# Patient Record
Sex: Male | Born: 1940 | Race: White | Hispanic: No | Marital: Married | Smoking: Former smoker
Health system: Southern US, Community
[De-identification: ages and names within clinical notes are randomized; demographics above are authoritative.]

## PROBLEM LIST (undated history)

## (undated) DIAGNOSIS — I499 Cardiac arrhythmia, unspecified: Secondary | ICD-10-CM

## (undated) DIAGNOSIS — I4891 Unspecified atrial fibrillation: Secondary | ICD-10-CM

## (undated) DIAGNOSIS — E162 Hypoglycemia, unspecified: Secondary | ICD-10-CM

## (undated) DIAGNOSIS — J302 Other seasonal allergic rhinitis: Secondary | ICD-10-CM

## (undated) HISTORY — PX: HERNIA REPAIR: SHX51

## (undated) HISTORY — PX: JOINT REPLACEMENT: SHX530

## (undated) HISTORY — PX: OTHER SURGICAL HISTORY: SHX169

---

## 2015-02-07 ENCOUNTER — Encounter
Admission: RE | Disposition: A | Discharge status: Home / Self Care | Payer: Self-pay | Source: Ambulatory Visit | Attending: Gastroenterology

## 2015-02-07 ENCOUNTER — Ambulatory Visit
Admission: RE | Admit: 2015-02-07 | Discharge: 2015-02-07 | Disposition: A | Discharge status: Home / Self Care | Payer: Commercial Managed Care - HMO | Source: Ambulatory Visit | Attending: Gastroenterology | Admitting: Gastroenterology

## 2015-02-07 ENCOUNTER — Encounter: Payer: Self-pay | Admitting: *Deleted

## 2015-02-07 ENCOUNTER — Ambulatory Visit: Payer: Commercial Managed Care - HMO | Admitting: Anesthesiology

## 2015-02-07 DIAGNOSIS — D125 Benign neoplasm of sigmoid colon: Secondary | ICD-10-CM | POA: Insufficient documentation

## 2015-02-07 DIAGNOSIS — Z87891 Personal history of nicotine dependence: Secondary | ICD-10-CM | POA: Insufficient documentation

## 2015-02-07 DIAGNOSIS — D127 Benign neoplasm of rectosigmoid junction: Secondary | ICD-10-CM | POA: Diagnosis not present

## 2015-02-07 DIAGNOSIS — D120 Benign neoplasm of cecum: Secondary | ICD-10-CM | POA: Insufficient documentation

## 2015-02-07 DIAGNOSIS — Z791 Long term (current) use of non-steroidal anti-inflammatories (NSAID): Secondary | ICD-10-CM | POA: Insufficient documentation

## 2015-02-07 DIAGNOSIS — Z1211 Encounter for screening for malignant neoplasm of colon: Secondary | ICD-10-CM | POA: Insufficient documentation

## 2015-02-07 DIAGNOSIS — Z79899 Other long term (current) drug therapy: Secondary | ICD-10-CM | POA: Diagnosis not present

## 2015-02-07 HISTORY — DX: Other seasonal allergic rhinitis: J30.2

## 2015-02-07 HISTORY — PX: COLONOSCOPY: SHX5424

## 2015-02-07 SURGERY — COLONOSCOPY
Anesthesia: General

## 2015-02-07 MED ORDER — SODIUM CHLORIDE 0.9 % IV SOLN: INTRAVENOUS | Status: DC

## 2015-02-07 MED ORDER — PROPOFOL INFUSION 10 MG/ML OPTIME
INTRAVENOUS | Status: DC | PRN
  Administered 2015-02-07: 150 ug/kg/min via INTRAVENOUS

## 2015-02-07 MED ORDER — PROPOFOL 10 MG/ML IV BOLUS
INTRAVENOUS | Status: DC | PRN
  Administered 2015-02-07: 50 mg via INTRAVENOUS

## 2015-02-07 MED ORDER — FENTANYL CITRATE (PF) 100 MCG/2ML IJ SOLN
INTRAMUSCULAR | Status: DC | PRN
  Administered 2015-02-07: 50 ug via INTRAVENOUS

## 2015-02-07 MED ORDER — SODIUM CHLORIDE 0.9 % IV SOLN
INTRAVENOUS | Status: DC
Start: 2015-02-07 — End: 2015-02-07
  Administered 2015-02-07 (×3): via INTRAVENOUS

## 2015-02-07 MED ORDER — MIDAZOLAM HCL 5 MG/5ML IJ SOLN
INTRAMUSCULAR | Status: DC | PRN
  Administered 2015-02-07: 2 mg via INTRAVENOUS

## 2015-02-07 NOTE — Transfer of Care (Signed)
Immediate Anesthesia Transfer of Care Note  Patient: Neil Garcia  Procedure(s) Performed: Procedure(s): COLONOSCOPY (N/A)  Patient Location: PACU  Anesthesia Type:General  Level of Consciousness: sedated  Airway & Oxygen Therapy: Patient Spontanous Breathing and Patient connected to nasal cannula oxygen  Post-op Assessment: Report given to RN and Post -op Vital signs reviewed and stable  Post vital signs: Reviewed and stable  Last Vitals:  Filed Vitals:   02/07/15 0949  BP: 158/76  Pulse: 72  Temp: 36.4 C  Resp: 14    Complications: No apparent anesthesia complications

## 2015-02-07 NOTE — Anesthesia Preprocedure Evaluation (Signed)
Anesthesia Evaluation  Patient identified by MRN, date of birth, ID band Patient awake    Reviewed: Allergy & Precautions, H&P , NPO status , Patient's Chart, lab work & pertinent test results, reviewed documented beta blocker date and time   Airway Mallampati: II  TM Distance: >3 FB Neck ROM: full    Dental no notable dental hx.    Pulmonary neg pulmonary ROS, former smoker,  breath sounds clear to auscultation  Pulmonary exam normal       Cardiovascular Exercise Tolerance: Good negative cardio ROS  Rhythm:regular Rate:Normal     Neuro/Psych negative neurological ROS  negative psych ROS   GI/Hepatic negative GI ROS, Neg liver ROS,   Endo/Other  negative endocrine ROS  Renal/GU negative Renal ROS  negative genitourinary   Musculoskeletal   Abdominal   Peds  Hematology negative hematology ROS (+)   Anesthesia Other Findings   Reproductive/Obstetrics negative OB ROS                             Anesthesia Physical Anesthesia Plan  ASA: II  Anesthesia Plan: General   Post-op Pain Management:    Induction:   Airway Management Planned:   Additional Equipment:   Intra-op Plan:   Post-operative Plan:   Informed Consent: I have reviewed the patients History and Physical, chart, labs and discussed the procedure including the risks, benefits and alternatives for the proposed anesthesia with the patient or authorized representative who has indicated his/her understanding and acceptance.   Dental Advisory Given  Plan Discussed with: CRNA  Anesthesia Plan Comments:         Anesthesia Quick Evaluation

## 2015-02-07 NOTE — Anesthesia Postprocedure Evaluation (Signed)
  Anesthesia Post-op Note  Patient: Neil Garcia  Procedure(s) Performed: Procedure(s): COLONOSCOPY (N/A)  Anesthesia type:General  Patient location: PACU  Post pain: Pain level controlled  Post assessment: Post-op Vital signs reviewed, Patient's Cardiovascular Status Stable, Respiratory Function Stable, Patent Airway and No signs of Nausea or vomiting  Post vital signs: Reviewed and stable  Last Vitals:  Filed Vitals:   02/07/15 1220  BP: 112/71  Pulse: 68  Temp:   Resp: 21    Level of consciousness: awake, alert  and patient cooperative  Complications: No apparent anesthesia complications

## 2015-02-07 NOTE — Discharge Instructions (Signed)

## 2015-02-07 NOTE — Op Note (Signed)
[facility] Gastroenterology Patient Name: Neil Garcia Procedure Date: 02/07/2015 10:53 AM MRN: [id number] Account #: [id number] Date of Birth: 07-29-1941 Admit Type: Outpatient Age: 74 Room: [facility] ENDO ROOM 2 Gender: Male Note Status: Finalized Procedure:         Colonoscopy Indications:       Screening for colorectal malignant neoplasm, This is the                     patient's first colonoscopy Patient Profile:   This is a 74 year old male. Providers:         Gerrit Heck. Rayann Heman, MD Referring MD:      Ocie Cornfield. Ouida Sills, MD (Referring MD) Medicines:         Propofol per Anesthesia Complications:     No immediate complications. Procedure:         Pre-Anesthesia Assessment:                    - Prior to the procedure, a History and Physical was                     performed, and patient medications, allergies and                     sensitivities were reviewed. The patient's tolerance of                     previous anesthesia was reviewed.                    After obtaining informed consent, the colonoscope was                     passed under direct vision. Throughout the procedure, the                     patient's blood pressure, pulse, and oxygen saturations                     were monitored continuously. The Colonoscope was                     introduced through the anus and advanced to the the cecum,                     identified by appendiceal orifice and ileocecal valve. The                     colonoscopy was performed without difficulty. The patient                     tolerated the procedure well. The quality of the bowel                     preparation was excellent. Findings:      The perianal and digital rectal examinations were normal.      A 8 mm polyp was found in the cecum. The polyp was sessile. The polyp       was removed with a hot snare. Resection and retrieval were complete.      A 2 mm polyp was found in the cecum. The polyp was sessile.  The polyp       was removed with a jumbo cold forceps. Resection and retrieval were       complete.  A 3 mm polyp was found in the cecum. The polyp was sessile. The polyp       was removed with a cold snare. Resection and retrieval were complete.      Three sessile polyps were found in the recto-sigmoid colon. The polyps       were 2 to 3 mm in size. These polyps were removed with a jumbo cold       forceps. Resection and retrieval were complete.      The exam was otherwise without abnormality on direct and retroflexion       views. Impression:        - One 8 mm polyp in the cecum. Resected and retrieved.                    - One 2 mm polyp in the cecum. Resected and retrieved.                    - One 3 mm polyp in the cecum. Resected and retrieved.                    - Three 2 to 3 mm polyps at the recto-sigmoid colon.                     Resected and retrieved.                    - The examination was otherwise normal on direct and                     retroflexion views. Recommendation:    - Observe patient in GI recovery unit.                    - High fiber diet.                    - Continue present medications.                    - Await pathology results.                    - Repeat colonoscopy for surveillance based on pathology                     results.                    - Return to referring physician.                    - The findings and recommendations were discussed with the                     patient.                    - The findings and recommendations were discussed with the                     patient's family. Procedure Code(s): --- Professional ---                    [phone], Colonoscopy, flexible; with removal of tumor(s),                     polyp(s), or other lesion(s) by snare technique  45380, 59, Colonoscopy, flexible; with biopsy, single or                     multiple CPT copyright 2014 American Medical Association. All rights  reserved. The codes documented in this report are preliminary and upon coder review may  be revised to meet current compliance requirements. Mellody Life, MD 02/07/2015 11:42:31 AM This report has been signed electronically. Number of Addenda: 0 Note Initiated On: 02/07/2015 10:53 AM Scope Withdrawal Time: 0 hours 15 minutes 6 seconds  Total Procedure Duration: 0 hours 18 minutes 52 seconds       [facility]

## 2015-02-07 NOTE — H&P (Signed)
  Primary Care Physician:  Kirk Ruths., MD  Pre-Procedure History & Physical: HPI:  Neil Garcia is a 74 y.o. male is here for an endoscopy.   Past Medical History  Diagnosis Date  . Seasonal allergies     Past Surgical History  Procedure Laterality Date  . Joint replacement      LT Hip 2013  . Collarbone Left   . Hernia repair      Prior to Admission medications   Medication Sig Start Date End Date Taking? Authorizing Provider  ibuprofen (ADVIL,MOTRIN) 200 MG tablet Take 400 mg by mouth every 6 (six) hours as needed for moderate pain.   Yes Historical Provider, MD  loratadine (CLARITIN) 10 MG tablet Take 10 mg by mouth daily as needed for allergies.   Yes Historical Provider, MD  terazosin (HYTRIN) 5 MG capsule Take 5 mg by mouth daily.   Yes Historical Provider, MD    Allergies as of 01/20/2015  . (Not on File)    History reviewed. No pertinent family history.  History   Social History  . Marital Status: Married    Spouse Name: N/A  . Number of Children: N/A  . Years of Education: N/A   Occupational History  . Not on file.   Social History Main Topics  . Smoking status: Former Research scientist (life sciences)  . Smokeless tobacco: Former Systems developer  . Alcohol Use: No  . Drug Use: No  . Sexual Activity: Not on file   Other Topics Concern  . Not on file   Social History Narrative  . No narrative on file     Physical Exam: BP 158/76 mmHg  Pulse 72  Temp(Src) 97.6 F (36.4 C) (Tympanic)  Resp 14  Ht 5\' 11"  (1.803 m)  Wt 294 lb (133.358 kg)  BMI 41.02 kg/m2  SpO2 98% General:   Alert,  pleasant and cooperative in NAD Head:  Normocephalic and atraumatic. Neck:  Supple; no masses or thyromegaly. Lungs:  Clear throughout to auscultation.    Heart:  Regular rate and rhythm. Abdomen:  Soft, nontender and nondistended. Normal bowel sounds, without guarding, and without rebound.   Neurologic:  Alert and  oriented x4;  grossly normal neurologically.  Impression/Plan: Neil L  Garcia is here for an endoscopy to be performed for screening  Risks, benefits, limitations, and alternatives regarding  colonoscopy have been reviewed with the patient.  Questions have been answered.  All parties agreeable.   Neil Class, MD  02/07/2015, 11:14 AM

## 2015-02-11 ENCOUNTER — Encounter: Payer: Self-pay | Admitting: Gastroenterology

## 2015-02-11 LAB — SURGICAL PATHOLOGY

## 2016-04-06 ENCOUNTER — Inpatient Hospital Stay
Admission: EM | Admit: 2016-04-06 | Discharge: 2016-04-08 | DRG: 871 | Disposition: A | Discharge status: Home / Self Care | Payer: Medicare HMO | Attending: Internal Medicine | Admitting: Internal Medicine

## 2016-04-06 ENCOUNTER — Emergency Department: Payer: Medicare HMO

## 2016-04-06 ENCOUNTER — Inpatient Hospital Stay
Admit: 2016-04-06 | Discharge: 2016-04-06 | Disposition: A | Discharge status: Home / Self Care | Payer: Medicare HMO | Attending: Specialist | Admitting: Specialist

## 2016-04-06 DIAGNOSIS — I4891 Unspecified atrial fibrillation: Secondary | ICD-10-CM | POA: Diagnosis present

## 2016-04-06 DIAGNOSIS — Z96642 Presence of left artificial hip joint: Secondary | ICD-10-CM | POA: Diagnosis present

## 2016-04-06 DIAGNOSIS — G4733 Obstructive sleep apnea (adult) (pediatric): Secondary | ICD-10-CM | POA: Diagnosis present

## 2016-04-06 DIAGNOSIS — Z6841 Body Mass Index (BMI) 40.0 and over, adult: Secondary | ICD-10-CM | POA: Diagnosis not present

## 2016-04-06 DIAGNOSIS — A419 Sepsis, unspecified organism: Secondary | ICD-10-CM | POA: Diagnosis present

## 2016-04-06 DIAGNOSIS — Z820 Family history of epilepsy and other diseases of the nervous system: Secondary | ICD-10-CM

## 2016-04-06 DIAGNOSIS — Z808 Family history of malignant neoplasm of other organs or systems: Secondary | ICD-10-CM | POA: Diagnosis not present

## 2016-04-06 DIAGNOSIS — Z87891 Personal history of nicotine dependence: Secondary | ICD-10-CM | POA: Diagnosis not present

## 2016-04-06 DIAGNOSIS — J189 Pneumonia, unspecified organism: Secondary | ICD-10-CM | POA: Diagnosis present

## 2016-04-06 HISTORY — DX: Hypoglycemia, unspecified: E16.2

## 2016-04-06 HISTORY — DX: Unspecified atrial fibrillation: I48.91

## 2016-04-06 LAB — COMPREHENSIVE METABOLIC PANEL
ALT: 21 U/L (ref 17–63)
AST: 27 U/L (ref 15–41)
Albumin: 4.3 g/dL (ref 3.5–5.0)
Alkaline Phosphatase: 67 U/L (ref 38–126)
Anion gap: 8 (ref 5–15)
BUN: 13 mg/dL (ref 6–20)
CHLORIDE: 104 mmol/L (ref 101–111)
CO2: 26 mmol/L (ref 22–32)
CREATININE: 0.96 mg/dL (ref 0.61–1.24)
Calcium: 8.9 mg/dL (ref 8.9–10.3)
GFR calc non Af Amer: >60 mL/min (ref >60)
Glucose, Bld: 98 mg/dL (ref 65–99)
Potassium: 4.3 mmol/L (ref 3.5–5.1)
SODIUM: 138 mmol/L (ref 135–145)
Total Bilirubin: 0.7 mg/dL (ref 0.3–1.2)
Total Protein: 7.3 g/dL (ref 6.5–8.1)

## 2016-04-06 LAB — URINALYSIS COMPLETE WITH MICROSCOPIC (ARMC ONLY)
BACTERIA UA: NONE SEEN
Bilirubin Urine: NEGATIVE
Glucose, UA: NEGATIVE mg/dL
HGB URINE DIPSTICK: NEGATIVE
Ketones, ur: NEGATIVE mg/dL
LEUKOCYTES UA: NEGATIVE
NITRITE: NEGATIVE
PROTEIN: NEGATIVE mg/dL
RBC / HPF: NONE SEEN RBC/hpf (ref 0–5)
SPECIFIC GRAVITY, URINE: 1.01 (ref 1.005–1.030)
Squamous Epithelial / LPF: NONE SEEN
pH: 5 (ref 5.0–8.0)

## 2016-04-06 LAB — CBC WITH DIFFERENTIAL/PLATELET
Basophils Absolute: 0 10*3/uL (ref 0–0.1)
Basophils Relative: 0 %
EOS ABS: 0.1 10*3/uL (ref 0–0.7)
Eosinophils Relative: 1 %
HEMATOCRIT: 44.9 % (ref 40.0–52.0)
HEMOGLOBIN: 15 g/dL (ref 13.0–18.0)
LYMPHS ABS: 1.4 10*3/uL (ref 1.0–3.6)
Lymphocytes Relative: 12 %
MCH: 27.8 pg (ref 26.0–34.0)
MCHC: 33.4 g/dL (ref 32.0–36.0)
MCV: 83.2 fL (ref 80.0–100.0)
MONOS PCT: 5 %
Monocytes Absolute: 0.7 10*3/uL (ref 0.2–1.0)
NEUTROS ABS: 10.3 10*3/uL — AB (ref 1.4–6.5)
NEUTROS PCT: 82 %
Platelets: 162 10*3/uL (ref 150–440)
RBC: 5.4 MIL/uL (ref 4.40–5.90)
RDW: 13.6 % (ref 11.5–14.5)
WBC: 12.6 10*3/uL — ABNORMAL HIGH (ref 3.8–10.6)

## 2016-04-06 LAB — TROPONIN I
Troponin I: <0.03 ng/mL (ref <0.03)
Troponin I: <0.03 ng/mL (ref <0.03)

## 2016-04-06 LAB — ECHOCARDIOGRAM COMPLETE
Height: 71 in
Weight: 4800 oz

## 2016-04-06 LAB — LACTIC ACID, PLASMA
Lactic Acid, Venous: 2.1 mmol/L (ref 0.5–1.9)
Lactic Acid, Venous: 1 mmol/L (ref 0.5–1.9)

## 2016-04-06 MED ORDER — PNEUMOCOCCAL VAC POLYVALENT 25 MCG/0.5ML IJ INJ: 0.5000 mL | INJECTION | INTRAMUSCULAR | Status: DC

## 2016-04-06 MED ORDER — SODIUM CHLORIDE 0.9 % IV BOLUS (SEPSIS)
1000.0000 mL | Freq: Once | INTRAVENOUS | Status: AC
Start: 2016-04-06 — End: 2016-04-06
  Administered 2016-04-06: 1000 mL via INTRAVENOUS

## 2016-04-06 MED ORDER — SODIUM CHLORIDE 0.9 % IV BOLUS (SEPSIS)
500.0000 mL | Freq: Once | INTRAVENOUS | Status: AC
  Administered 2016-04-06: 500 mL via INTRAVENOUS

## 2016-04-06 MED ORDER — ENOXAPARIN SODIUM 40 MG/0.4ML ~~LOC~~ SOLN
40.0000 mg | SUBCUTANEOUS | Status: DC
  Administered 2016-04-06: 40 mg via SUBCUTANEOUS
  Filled 2016-04-06: qty 0.4

## 2016-04-06 MED ORDER — ACETAMINOPHEN 650 MG RE SUPP: 650.0000 mg | Freq: Four times a day (QID) | RECTAL | Status: DC | PRN

## 2016-04-06 MED ORDER — ASPIRIN EC 81 MG PO TBEC
81.0000 mg | DELAYED_RELEASE_TABLET | Freq: Every day | ORAL | Status: DC
  Administered 2016-04-06 – 2016-04-08 (×3): 81 mg via ORAL
  Filled 2016-04-06 (×3): qty 1

## 2016-04-06 MED ORDER — ONDANSETRON HCL 4 MG PO TABS: 4.0000 mg | ORAL_TABLET | Freq: Four times a day (QID) | ORAL | Status: DC | PRN

## 2016-04-06 MED ORDER — DEXTROSE 5 % IV SOLN
250.0000 mg | INTRAVENOUS | Status: DC
  Administered 2016-04-07: 250 mg via INTRAVENOUS
  Filled 2016-04-06 (×2): qty 250

## 2016-04-06 MED ORDER — SODIUM CHLORIDE 0.9 % IV BOLUS (SEPSIS)
1000.0000 mL | Freq: Once | INTRAVENOUS | Status: AC
  Administered 2016-04-06: 1000 mL via INTRAVENOUS

## 2016-04-06 MED ORDER — DEXTROSE 5 % IV SOLN
1.0000 g | INTRAVENOUS | Status: DC
  Administered 2016-04-07: 1 g via INTRAVENOUS
  Filled 2016-04-06 (×2): qty 10

## 2016-04-06 MED ORDER — ACETAMINOPHEN 325 MG PO TABS: 650.0000 mg | ORAL_TABLET | Freq: Four times a day (QID) | ORAL | Status: DC | PRN

## 2016-04-06 MED ORDER — DILTIAZEM HCL 30 MG PO TABS
30.0000 mg | ORAL_TABLET | Freq: Four times a day (QID) | ORAL | Status: DC
  Administered 2016-04-06 – 2016-04-08 (×7): 30 mg via ORAL
  Filled 2016-04-06 (×7): qty 1

## 2016-04-06 MED ORDER — ONDANSETRON HCL 4 MG/2ML IJ SOLN: 4.0000 mg | Freq: Four times a day (QID) | INTRAMUSCULAR | Status: DC | PRN

## 2016-04-06 MED ORDER — DEXTROSE 5 % IV SOLN: 500.0000 mg | INTRAVENOUS | Status: DC

## 2016-04-06 MED ORDER — SODIUM CHLORIDE 0.9% FLUSH
3.0000 mL | Freq: Two times a day (BID) | INTRAVENOUS | Status: DC
  Administered 2016-04-06 – 2016-04-07 (×4): 3 mL via INTRAVENOUS

## 2016-04-06 MED ORDER — DILTIAZEM HCL 25 MG/5ML IV SOLN: 10.0000 mg | Freq: Four times a day (QID) | INTRAVENOUS | Status: DC | PRN

## 2016-04-06 MED ORDER — TERAZOSIN HCL 5 MG PO CAPS
5.0000 mg | ORAL_CAPSULE | Freq: Every day | ORAL | Status: DC
  Administered 2016-04-06 – 2016-04-07 (×2): 5 mg via ORAL
  Filled 2016-04-06 (×3): qty 1

## 2016-04-06 MED ORDER — DEXTROSE 5 % IV SOLN
1.0000 g | Freq: Once | INTRAVENOUS | Status: AC
  Administered 2016-04-06: 1 g via INTRAVENOUS
  Filled 2016-04-06: qty 10

## 2016-04-06 MED ORDER — CEFTRIAXONE SODIUM 1 G IJ SOLR: 1.0000 g | INTRAMUSCULAR | Status: DC

## 2016-04-06 MED ORDER — DILTIAZEM HCL 25 MG/5ML IV SOLN
10.0000 mg | Freq: Once | INTRAVENOUS | Status: AC
  Administered 2016-04-06: 10 mg via INTRAVENOUS
  Filled 2016-04-06: qty 5

## 2016-04-06 MED ORDER — LORATADINE 10 MG PO TABS
10.0000 mg | ORAL_TABLET | Freq: Every day | ORAL | Status: DC
  Administered 2016-04-06 – 2016-04-08 (×3): 10 mg via ORAL
  Filled 2016-04-06 (×3): qty 1

## 2016-04-06 MED ORDER — DEXTROSE 5 % IV SOLN
500.0000 mg | Freq: Once | INTRAVENOUS | Status: AC
  Administered 2016-04-06: 500 mg via INTRAVENOUS
  Filled 2016-04-06: qty 500

## 2016-04-06 NOTE — ED Triage Notes (Addendum)
Pt c/o cough with congestion for the past week and a half. States he has brown, blood tinged sputum. Pt c/o periods of dizziness..the patient is in NAD, respirations WNL.Marland Kitchen

## 2016-04-06 NOTE — Progress Notes (Signed)
*  PRELIMINARY RESULTS* Echocardiogram 2D Echocardiogram has been performed.  Sherrie Sport 04/06/2016, 3:41 PM

## 2016-04-06 NOTE — H&P (Signed)
[facility] [place] NAME: Neil Garcia    MR#:  [id number]  DATE OF BIRTH:  07-Oct-1940  DATE OF ADMISSION:  04/06/2016  PRIMARY CARE PHYSICIAN: Kirk Ruths., MD   REQUESTING/REFERRING PHYSICIAN: Dr. Kerman Passey  CHIEF COMPLAINT:   Chief Complaint  Patient presents with  . Cough    HISTORY OF PRESENT ILLNESS:  Neil Garcia  is a 75 y.o. male with a known history Hypoglycemia, who resents to the hospital complaining of cough, shortness of breath with blood-tinged sputum progressively getting worse. Patient says for the past 2 days he has just not been feeling well with the cough being the most bothersome symptom. His cough kept him up most of the night last night and this morning he noticed that he had blood-tinged sputum and he was a bit concerned and came to the ER further evaluation. He was noted to be in rapid atrial fibrillation with RVR with heart rates in the 180s and also chest findings suggestive of a right middle lobe pneumonia. He did have a fever of 100.4 yesterday at home. He denies any recent sick contacts. Patient was given some IV Cardizem which improved his heart rate and given his clinical symptoms hospitalist services were contacted further treatment and evaluation.  PAST MEDICAL HISTORY:   Past Medical History:  Diagnosis Date  . Atrial fibrillation ([place])   . Hypoglycemia   . Seasonal allergies     PAST SURGICAL HISTORY:   Past Surgical History:  Procedure Laterality Date  . collarbone Left   . COLONOSCOPY N/A 02/07/2015   Procedure: COLONOSCOPY;  Surgeon: Josefine Class, MD;  Location: [facility] ENDOSCOPY;  Service: Endoscopy;  Laterality: N/A;  . HERNIA REPAIR    . JOINT REPLACEMENT     LT Hip 2013    SOCIAL HISTORY:   Social History  Substance Use Topics  . Smoking status: Former Research scientist (life sciences)  . Smokeless tobacco: Former Systems developer  . Alcohol use No    FAMILY HISTORY:   Family History  Problem Relation Age of  Onset  . Alzheimer's disease Mother   . Brain cancer Father     DRUG ALLERGIES:   Allergies  Allergen Reactions  . Prednisone Other (See Comments)    Causes patient to become very nervous; insomnia  . Statins Other (See Comments)    Cramping and weak   . Celecoxib Rash and Other (See Comments)    Nervousness, hyper     REVIEW OF SYSTEMS:   Review of Systems  Constitutional: Negative for fever and weight loss.  HENT: Negative for congestion, nosebleeds and tinnitus.   Eyes: Negative for blurred vision, double vision and redness.  Respiratory: Positive for cough, hemoptysis, sputum production and shortness of breath.   Cardiovascular: Negative for chest pain, orthopnea, leg swelling and PND.  Gastrointestinal: Negative for abdominal pain, diarrhea, melena, nausea and vomiting.  Genitourinary: Negative for dysuria, hematuria and urgency.  Musculoskeletal: Negative for falls and joint pain.  Neurological: Negative for dizziness, tingling, sensory change, focal weakness, seizures, weakness and headaches.  Endo/Heme/Allergies: Negative for polydipsia. Does not bruise/bleed easily.  Psychiatric/Behavioral: Negative for depression and memory loss. The patient is not nervous/anxious.     MEDICATIONS AT HOME:   Prior to Admission medications   Medication Sig Start Date End Date Taking? Authorizing Provider  aspirin EC 81 MG tablet Take 162 mg by mouth once.   Yes Historical Provider, MD  ibuprofen (ADVIL,MOTRIN) 200 MG tablet Take 400 mg by mouth  every 6 (six) hours as needed for moderate pain.   Yes Historical Provider, MD  loratadine (CLARITIN) 10 MG tablet Take 10 mg by mouth daily.    Yes Historical Provider, MD  terazosin (HYTRIN) 5 MG capsule Take 5 mg by mouth at bedtime.    Yes Historical Provider, MD      VITAL SIGNS:  Blood pressure 137/68, pulse (!) 110, temperature 98.2 F (36.8 C), temperature source Oral, resp. rate 15, height 5' 11"  (1.803 m), weight 136.1 kg (300  lb), SpO2 98 %.  PHYSICAL EXAMINATION:  Physical Exam  GENERAL:  75 y.o.-year-old obese patient lying in the bed in mild resp. Distress.   EYES: Pupils equal, round, reactive to light and accommodation. No scleral icterus. Extraocular muscles intact.  HEENT: Head atraumatic, normocephalic. Oropharynx and nasopharynx clear. No oropharyngeal erythema, moist oral mucosa  NECK:  Supple, no jugular venous distention. No thyroid enlargement, no tenderness.  LUNGS: Poor respiratory effort, Minmal rhonchi on right > left, no rales, Wheezing. Negative use of accessory muscles.  CARDIOVASCULAR: S1, S2 Tachycardic. No murmurs, rubs, gallops, clicks.  ABDOMEN: Soft, nontender, nondistended. Bowel sounds present. No organomegaly or mass.  EXTREMITIES: No pedal edema, cyanosis, or clubbing. + 2 pedal & radial pulses b/l.   NEUROLOGIC: Cranial nerves II through XII are intact. No focal Motor or sensory deficits appreciated b/l PSYCHIATRIC: The patient is alert and oriented x 3. Good affect.  SKIN: No obvious rash, lesion, or ulcer.   LABORATORY PANEL:   CBC  Recent Labs Lab 04/06/16 0916  WBC 12.6*  HGB 15.0  HCT 44.9  PLT 162   ------------------------------------------------------------------------------------------------------------------  Chemistries   Recent Labs Lab 04/06/16 0916  NA 138  K 4.3  CL 104  CO2 26  GLUCOSE 98  BUN 13  CREATININE 0.96  CALCIUM 8.9  AST 27  ALT 21  ALKPHOS 67  BILITOT 0.7   ------------------------------------------------------------------------------------------------------------------  Cardiac Enzymes  Recent Labs Lab 04/06/16 0916  TROPONINI <0.03   ------------------------------------------------------------------------------------------------------------------  RADIOLOGY:  Dg Chest 2 View  Result Date: 04/06/2016 CLINICAL DATA:  Cough with congestion, shortness of breath EXAM: CHEST  2 VIEW COMPARISON:  None available FINDINGS:  Asymmetric ill-defined patchy bronchovascular opacity in the right mid and lower lung on the frontal view which appears to be anterior on the lateral. This is suspicious for a right middle lobe bronchopneumonia. Mild cardiac enlargement without CHF or edema. No effusion or pneumothorax. Trachea is midline. Degenerative spondylosis of the spine. IMPRESSION: Findings suspicious for right middle lobe bronchopneumonia. Recommend short-term radiographic follow-up to document resolution. Mild cardiomegaly without CHF Electronically Signed   By: Jerilynn Mages.  Shick M.D.   On: 04/06/2016 09:51    IMPRESSION AND PLAN:  75 year old male with past medical history of hypoglycemia who presents to the hospital shortness of breath, cough and blood-tinged sputum and noted to have pneumonia and also in A. fib with RVR.  1. Sepsis-patient met criteria on admission given his leukocytosis, elevated lactic acid, and chest x-ray findings suggestive of pneumonia. -I will treat the patient with IV ceftriaxone and Zithromax for pneumonia. Follow white cell count, follow blood, sputum cultures.  2. Pneumonia-likely community acquired. -Chest x-ray findings suggestive of right middle lobe pneumonia. Continue IV ceftriaxone, Zithromax, follow blood, sputum cultures.  3. Atrial fibrillation with rapid ventricular response-patient presented to the hospital with heart rates in the 180s. This could be secondary to the underlying sepsis/pneumonia. -I will place him on telemetry, cycle his cardiac markers, check a two-dimensional echocardiogram. -  Place on oral Cardizem for rate control, also IV Cardizem as needed. Cardiology consult.  4. Leukocytosis-secondary to pneumonia and sepsis. -Follow white cell count with IV antibiotic therapy.    All the records are reviewed and case discussed with ED provider. Management plans discussed with the patient, family and they are in agreement.  CODE STATUS:  Full  TOTAL TIME TAKING CARE OF THIS  PATIENT:  45 minutes.    Henreitta Leber M.D on 04/06/2016 at 12:31 PM  Between 7am to 6pm - Pager - [phone]  After 6pm go to [email or web address] - password [email or web address] [facility]  Office  [phone]  CC: Primary care physician; Kirk Ruths., MD

## 2016-04-06 NOTE — Consult Note (Signed)
[facility] [facility]  CARDIOLOGY CONSULT NOTE  Patient ID: Neil Garcia MRN: [id number] DOB/AGE: Jan 19, 1941 75 y.o.  Admit date: 04/06/2016 Referring Physician Dr. Posey Pronto Primary Physician Dr. Rosanna Randy Primary Cardiologist   Reason for Consultation afib  HPI: Pt I a 75 yo male with no prior cardiac history who was admitted with complaints of sob and was noted to have cxr showing right middle lobe bronchopneumonia with no evidence of chf. He was noted to haave afib with rvr. This was an apparent new finding for him. He is unaware of the rvr and feels a lot better since admission. CHADSS score is 0. Echo revealed preserved ef of 55-60 with mildly dilated right and left atrium. Pt denies diagnosis of sleep apnea or copd. He is not on oxygen at home.  He is currently hemodynamically stable.   Review of Systems  Constitutional: Positive for fever.  HENT: Negative.   Eyes: Negative.   Respiratory: Positive for cough and shortness of breath.   Cardiovascular: Positive for leg swelling.  Gastrointestinal: Negative.   Genitourinary: Negative.   Musculoskeletal: Negative.   Skin: Negative.   Neurological: Negative.   Endo/Heme/Allergies: Negative.   Psychiatric/Behavioral: Negative.     Past Medical History:  Diagnosis Date  . Atrial fibrillation ([place])   . Hypoglycemia   . Seasonal allergies     Family History  Problem Relation Age of Onset  . Alzheimer's disease Mother   . Brain cancer Father     Social History   Social History  . Marital status: Married    Spouse name: N/A  . Number of children: N/A  . Years of education: N/A   Occupational History  . Not on file.   Social History Main Topics  . Smoking status: Former Research scientist (life sciences)  . Smokeless tobacco: Former Systems developer  . Alcohol use No  . Drug use: No  . Sexual activity: Not on file   Other Topics Concern  . Not on file   Social History Narrative  . No narrative on file    Past Surgical  History:  Procedure Laterality Date  . collarbone Left   . COLONOSCOPY N/A 02/07/2015   Procedure: COLONOSCOPY;  Surgeon: Josefine Class, MD;  Location: [facility] ENDOSCOPY;  Service: Endoscopy;  Laterality: N/A;  . HERNIA REPAIR    . JOINT REPLACEMENT     LT Hip 2013     Prescriptions Prior to Admission  Medication Sig Dispense Refill Last Dose  . aspirin EC 81 MG tablet Take 162 mg by mouth once.   04/06/2016 at 0100  . ibuprofen (ADVIL,MOTRIN) 200 MG tablet Take 400 mg by mouth every 6 (six) hours as needed for moderate pain.   PRN at prn  . loratadine (CLARITIN) 10 MG tablet Take 10 mg by mouth daily.    04/05/2016 at am  . terazosin (HYTRIN) 5 MG capsule Take 5 mg by mouth at bedtime.    04/05/2016 at pm    Physical Exam: Blood pressure (!) 147/83, pulse 100, temperature 99.1 F (37.3 C), temperature source Oral, resp. rate 18, height 5\' 11"  (1.803 m), weight 136.1 kg (300 lb), SpO2 99 %.   Wt Readings from Last 1 Encounters:  04/06/16 136.1 kg (300 lb)     General appearance: alert and cooperative Head: Normocephalic, without obvious abnormality, atraumatic Resp: diminished breath sounds bilaterally Cardio: irregularly irregular rhythm GI: soft, non-tender; bowel sounds normal; no masses,  no organomegaly Extremities: edema 2+ edema in lower extremeties Neurologic:  Grossly normal  Labs:   Lab Results  Component Value Date   WBC 12.6 (H) 04/06/2016   HGB 15.0 04/06/2016   HCT 44.9 04/06/2016   MCV 83.2 04/06/2016   PLT 162 04/06/2016    Recent Labs Lab 04/06/16 0916  NA 138  K 4.3  CL 104  CO2 26  BUN 13  CREATININE 0.96  CALCIUM 8.9  PROT 7.3  BILITOT 0.7  ALKPHOS 67  ALT 21  AST 27  GLUCOSE 98   Lab Results  Component Value Date   TROPONINI <0.03 04/06/2016      Radiology: probable lll pna EKG: afib with controlled vr.   ASSESSMENT AND PLAN:  Pt with admission for community acquired pna. Note to have afib with rvr which has improved with  treatemnt of of his pnq. Afib is likely secondary to his pna. Will continue with rate control for now with cardizem. Will change to cardizem cd 180 daily at discharge. Defer chronic anticoagulation for now.   Signed: Teodoro Spray MD, [facility] 04/06/2016, 9:51 PM

## 2016-04-06 NOTE — Progress Notes (Signed)
Pharmacy Antibiotic Note  Abdelaziz Burts Luckenbaugh is a 75 y.o. male admitted on 04/06/2016 with pneumonia.  Pharmacy has been consulted for ceftriaxone and azithromycin dosing.  Plan:  Patient received azithromycin 500mg  IV and ceftriaxone 1g IV in the ED. Will begin azithromycin 250mg  IV q24 and ceftriaxone 1g IV q24 tomorrow morning.   Height: 5\' 11"  (180.3 cm) Weight: 300 lb (136.1 kg) IBW/kg (Calculated) : 75.3  Temp (24hrs), Avg:98.1 F (36.7 C), Min:98 F (36.7 C), Max:98.2 F (36.8 C)   Recent Labs Lab 04/06/16 0916 04/06/16 1037  WBC 12.6*  --   CREATININE 0.96  --   LATICACIDVEN  --  2.1*    Estimated Creatinine Clearance: 98 mL/min (by C-G formula based on SCr of 0.96 mg/dL).    Allergies  Allergen Reactions  . Prednisone Other (See Comments)    Causes patient to become very nervous; insomnia  . Statins Other (See Comments)    Cramping and weak   . Celecoxib Rash and Other (See Comments)    Nervousness, hyper     Antimicrobials this admission: Azithromycin 07/25 >>  Ceftriaxone 07/25 >>   Microbiology results: 07/25 BCx: Sent 07/25 UCx: Sent  07/25 Sputum: Sent   Thank you for allowing pharmacy to be a part of this patient's care.  Darrow Bussing, PharmD Pharmacy Resident 04/06/2016 3:06 PM

## 2016-04-06 NOTE — ED Provider Notes (Signed)
[facility] Emergency Department Provider Note  Time seen: 10:31 AM  I have reviewed the triage vital signs and the nursing notes.   HISTORY  Chief Complaint Cough    HPI Neil Garcia is a 75 y.o. male who presents to the emergency department with shortness of breath cough and congestion for the past one week. According to the patient for the past one week he has been feeling very short of breath especially with exertion. He states over the past 2 days he has had a very significant cough, today it was producing a lot of brown/bloody streaked sputum so he came to the emergency department for evaluation. Patient states chest tightness but denies any "pain." Denies abdominal pain, nausea, vomiting. States subjective fever at times. Describes his chest tightness as moderate currently.   Past Medical History:  Diagnosis Date  . Hypoglycemia   . Seasonal allergies     There are no active problems to display for this patient.   Past Surgical History:  Procedure Laterality Date  . collarbone Left   . COLONOSCOPY N/A 02/07/2015   Procedure: COLONOSCOPY;  Surgeon: Josefine Class, MD;  Location: [facility] ENDOSCOPY;  Service: Endoscopy;  Laterality: N/A;  . HERNIA REPAIR    . JOINT REPLACEMENT     LT Hip 2013    Current Outpatient Rx  . Order #: [id number] Class: Historical Med  . Order #: [id number] Class: Historical Med  . Order #: [id number] Class: Historical Med    Allergies Prednisone; Statins; and Celecoxib  No family history on file.  Social History Social History  Substance Use Topics  . Smoking status: Former Research scientist (life sciences)  . Smokeless tobacco: Former Systems developer  . Alcohol use No    Review of Systems Constitutional: Negative for fever. Cardiovascular: Positive chest tightness. Respiratory: Positive shortness of breath. Positive cough and sputum production. Gastrointestinal: Negative for abdominal pain, Musculoskeletal: Negative for back  pain. Neurological: Negative for headache 10-point ROS otherwise negative.  ____________________________________________   PHYSICAL EXAM:  VITAL SIGNS: ED Triage Vitals [04/06/16 0926]  Enc Vitals Group     BP (!) 141/105     Pulse Rate 88     Resp 20     Temp 98.2 F (36.8 C)     Temp Source Oral     SpO2 94 %     Weight 300 lb (136.1 kg)     Height 5\' 11"  (1.803 m)     Head Circumference      Peak Flow      Pain Score 6     Pain Loc      Pain Edu?      Excl. in [place]?     Constitutional: Alert and oriented. Well appearing and in no distress. Eyes: Normal exam ENT   Head: Normocephalic and atraumatic   Mouth/Throat: Mucous membranes are moist. Cardiovascular: Irregular rhythm, rate around 150 bpm. Respiratory: Normal respiratory effort without tachypnea nor retractions. Breath sounds are clear  Gastrointestinal: Soft and nontender. No distention. Musculoskeletal: Nontender with normal range of motion in all extremities. No lower extremity tenderness or edema. Neurologic:  Normal speech and language. No gross focal neurologic deficits  Skin:  Skin is warm, dry and intact.  Psychiatric: Mood and affect are normal. Speech and behavior are normal.  ____________________________________________    EKG  EKG reviewed and interpreted by myself shows atrial fibrillation at 135 bpm, narrow QRS, normal axis, normal intervals. Nonspecific ST changes. EKG consistent atrial fibrillation with rapid ventricular response.  ____________________________________________  RADIOLOGY  X-ray consistent with right-sided pneumonia  ____________________________________________ CRITICAL CARE Performed by: Harvest Dark   Total critical care time: 60 minutes  Critical care time was exclusive of separately billable procedures and treating other patients.  Critical care was necessary to treat or prevent imminent or life-threatening deterioration.  Critical care was time  spent personally by me on the following activities: development of treatment plan with patient and/or surrogate as well as nursing, discussions with consultants, evaluation of patient's response to treatment, examination of patient, obtaining history from patient or surrogate, ordering and performing treatments and interventions, ordering and review of laboratory studies, ordering and review of radiographic studies, pulse oximetry and re-evaluation of patient's condition.    INITIAL IMPRESSION / ASSESSMENT AND PLAN / ED COURSE  Pertinent labs & imaging results that were available during my care of the patient were reviewed by me and considered in my medical decision making (see chart for details).  The patient presents to the emergency department with shortness breath, cough and sputum production. EKG consistent with atrial fibrillation with rapid ventricular response, blood pressure currently 90/60. We will dose IV fluids, start IV antibiotics. Patient is mentating well. No history of atrial fibrillation in the past. Suspect sepsis due to pneumonia causing atrial fibrillation. Patient did have a run of approximately 6-7 beats of ventricular tachycardia, I placed him on the Zoll monitor.  Patient's x-ray consistent with a right-sided pneumonia. Patient's blood pressure appears to be responding nicely to IV fluids currently 137/68. Remains to be in atrial fibrillation with a pulse rate of 140 bpm. Of note the patient's vitals which are recorded do not reflect his atrial fibrillation with RVR that the patient has been in the entire time he has been in the emergency department. I will dose 10 mg of diltiazem IV at this time. We will continue with treatment for likely sepsis due to right-sided pneumonia and new onset atrial for ablation with rapid ventricular response.  Patient's heart rate down to 108 bpm after 10 mg of IV diltiazem. CBC shows mild leukocytosis of 12,000, CMP largely within normal limits,  troponin negative. I discussed admission to the intensive care unit, and they believe the patient is more appropriate for stepdown of the floor, discussed with the hospitalist will be admitting the patient.  ____________________________________________   FINAL CLINICAL IMPRESSION(S) / ED DIAGNOSES  New onset atrial fibrillation with rapid ventricular response Sepsis Right-sided pneumonia    Harvest Dark, MD 04/06/16 1132

## 2016-04-07 LAB — BASIC METABOLIC PANEL
ANION GAP: 7 (ref 5–15)
BUN: 11 mg/dL (ref 6–20)
CO2: 27 mmol/L (ref 22–32)
Calcium: 7.9 mg/dL — ABNORMAL LOW (ref 8.9–10.3)
Chloride: 104 mmol/L (ref 101–111)
Creatinine, Ser: 0.94 mg/dL (ref 0.61–1.24)
GFR calc Af Amer: >60 mL/min (ref >60)
GFR calc non Af Amer: >60 mL/min (ref >60)
GLUCOSE: 110 mg/dL — AB (ref 65–99)
POTASSIUM: 3.5 mmol/L (ref 3.5–5.1)
Sodium: 138 mmol/L (ref 135–145)

## 2016-04-07 LAB — TROPONIN I: Troponin I: <0.03 ng/mL (ref <0.03)

## 2016-04-07 LAB — EXPECTORATED SPUTUM ASSESSMENT W GRAM STAIN, RFLX TO RESP C: Special Requests: NORMAL

## 2016-04-07 LAB — EXPECTORATED SPUTUM ASSESSMENT W REFEX TO RESP CULTURE

## 2016-04-07 LAB — CBC
HEMATOCRIT: 39.5 % — AB (ref 40.0–52.0)
HEMOGLOBIN: 13.1 g/dL (ref 13.0–18.0)
MCH: 27.8 pg (ref 26.0–34.0)
MCHC: 33.1 g/dL (ref 32.0–36.0)
MCV: 84 fL (ref 80.0–100.0)
Platelets: 151 10*3/uL (ref 150–440)
RBC: 4.69 MIL/uL (ref 4.40–5.90)
RDW: 13.9 % (ref 11.5–14.5)
WBC: 8 10*3/uL (ref 3.8–10.6)

## 2016-04-07 LAB — URINE CULTURE

## 2016-04-07 MED ORDER — ENOXAPARIN SODIUM 40 MG/0.4ML ~~LOC~~ SOLN
40.0000 mg | Freq: Two times a day (BID) | SUBCUTANEOUS | Status: DC
  Administered 2016-04-07: 40 mg via SUBCUTANEOUS
  Filled 2016-04-07: qty 0.4

## 2016-04-07 MED ORDER — GUAIFENESIN-DM 100-10 MG/5ML PO SYRP
5.0000 mL | ORAL_SOLUTION | ORAL | Status: DC | PRN
  Administered 2016-04-07 (×2): 5 mL via ORAL
  Filled 2016-04-07 (×2): qty 5

## 2016-04-07 NOTE — Progress Notes (Signed)
Patient has no acute even overnight. Robitussin PRN was order per standing order. He remained in controlled afib with stable VS. He denied pain, N&V. Patient needed items were placed within patient's reach and assisted through the night as needed.

## 2016-04-07 NOTE — Progress Notes (Signed)
Anticoagulation monitoring(Lovenox):  74yo  male ordered Lovenox 40 mg Q24h  Filed Weights   04/06/16 0926  Weight: 300 lb (136.1 kg)   BMI 41   Lab Results  Component Value Date   CREATININE 0.94 04/07/2016   CREATININE 0.96 04/06/2016   Estimated Creatinine Clearance: 100.1 mL/min (by C-G formula based on SCr of 0.94 mg/dL). Hemoglobin & Hematocrit     Component Value Date/Time   HGB 13.1 04/07/2016 0338   HCT 39.5 (L) 04/07/2016 [id number]     Per Protocol for Patient with estCrcl < 30 ml/min and BMI < 40, will transition to Lovenox 40 mg Q12h.     Ramond Dial, Pharm.D Clinical Pharmacist 04/07/2016  12:48 PM

## 2016-04-07 NOTE — Care Management (Signed)
Patient presents from home for cough.  Found to be in atrial fibrillation with heart rate in the 180 range.  Also found  to have pneumonia.  Patient does have prior history of a fib.  it is though exacerbation is caused by pneumonia.    Will defer anticoagulation for now.  Patient is current with his pcp and no issues accessing medical care, obtaining meds or with transportation.  No indication that patient will require home 02.  Independent in his adls.

## 2016-04-07 NOTE — Progress Notes (Signed)
[place] at [place] NAME: Neil Garcia    MR#:  [id number]  DATE OF BIRTH:  [birth year]  SUBJECTIVE:  Feels a lot better HR improving. No fever  REVIEW OF SYSTEMS:   Review of Systems  Constitutional: Negative for chills, fever and weight loss.  HENT: Negative for ear discharge, ear pain and nosebleeds.   Eyes: Negative for blurred vision, pain and discharge.  Respiratory: Negative for sputum production, shortness of breath, wheezing and stridor.   Cardiovascular: Negative for chest pain, palpitations, orthopnea and PND.  Gastrointestinal: Negative for abdominal pain, diarrhea, nausea and vomiting.  Genitourinary: Negative for frequency and urgency.  Musculoskeletal: Negative for back pain and joint pain.  Neurological: Negative for sensory change, speech change, focal weakness and weakness.  Psychiatric/Behavioral: Negative for depression and hallucinations. The patient is not nervous/anxious.   All other systems reviewed and are negative.  Tolerating Diet:yes Tolerating PT: not needed  DRUG ALLERGIES:   Allergies  Allergen Reactions  . Prednisone Other (See Comments)    Causes patient to become very nervous; insomnia  . Statins Other (See Comments)    Cramping and weak   . Celecoxib Rash and Other (See Comments)    Nervousness, hyper     VITALS:  Blood pressure 135/71, pulse 97, temperature 98.2 F (36.8 C), temperature source Oral, resp. rate 16, height 5' 11"  (1.803 m), weight 136.1 kg (300 lb), SpO2 95 %.  PHYSICAL EXAMINATION:   Physical Exam  GENERAL:  75 y.o.-year-old patient lying in the bed with no acute distress. morbid obese EYES: Pupils equal, round, reactive to light and accommodation. No scleral icterus. Extraocular muscles intact.  HEENT: Head atraumatic, normocephalic. Oropharynx and nasopharynx clear.  NECK:  Supple, no jugular venous distention. No thyroid enlargement, no tenderness.  LUNGS: Normal  breath sounds bilaterally, no wheezing, rales, rhonchi. No use of accessory muscles of respiration.  CARDIOVASCULAR: S1, S2 normal. No murmurs, rubs, or gallops. Irregularly irregular ABDOMEN: Soft, nontender, nondistended. Bowel sounds present. No organomegaly or mass.  EXTREMITIES: No cyanosis, clubbing or edema b/l.    NEUROLOGIC: Cranial nerves II through XII are intact. No focal Motor or sensory deficits b/l.   PSYCHIATRIC:  patient is alert and oriented x 3.  SKIN: No obvious rash, lesion, or ulcer.   LABORATORY PANEL:  CBC  Recent Labs Lab 04/07/16 0338  WBC 8.0  HGB 13.1  HCT 39.5*  PLT 151    Chemistries   Recent Labs Lab 04/06/16 0916 04/07/16 0338  NA 138 138  K 4.3 3.5  CL 104 104  CO2 26 27  GLUCOSE 98 110*  BUN 13 11  CREATININE 0.96 0.94  CALCIUM 8.9 7.9*  AST 27  --   ALT 21  --   ALKPHOS 67  --   BILITOT 0.7  --    Cardiac Enzymes  Recent Labs Lab 04/06/16 2303  TROPONINI <0.03   RADIOLOGY:  Dg Chest 2 View  Result Date: 04/06/2016 CLINICAL DATA:  Cough with congestion, shortness of breath EXAM: CHEST  2 VIEW COMPARISON:  None available FINDINGS: Asymmetric ill-defined patchy bronchovascular opacity in the right mid and lower lung on the frontal view which appears to be anterior on the lateral. This is suspicious for a right middle lobe bronchopneumonia. Mild cardiac enlargement without CHF or edema. No effusion or pneumothorax. Trachea is midline. Degenerative spondylosis of the spine. IMPRESSION: Findings suspicious for right middle lobe bronchopneumonia. Recommend short-term radiographic follow-up to document  resolution. Mild cardiomegaly without CHF Electronically Signed   By: Jerilynn Mages.  Shick M.D.   On: 04/06/2016 09:51  ASSESSMENT AND PLAN:   75 year old male with past medical history of hypoglycemia who presents to the hospital shortness of breath, cough and blood-tinged sputum and noted to have pneumonia and also in A. fib with RVR.  1.  Sepsis-patient met criteria on admission given his leukocytosis, elevated lactic acid, and chest x-ray findings suggestive of pneumonia. -cont IV ceftriaxone and Zithromax for pneumonia. - Follow white cell count, follow blood, sputum cultures.  2. Pneumonia-likely community acquired. -Chest x-ray findings suggestive of right middle lobe pneumonia.  -Continue IV ceftriaxone, Zithromax  3. Atrial fibrillation with rapid ventricular response-patient presented to the hospital with heart rates in the 180s. This could be secondary to the underlying sepsis/pneumonia. -Place on oral Cardizem for rate control, also IV Cardizem as needed.  -Cardiology consult with dr Ubaldo Glassing appreciated. No need for chronic anticoagulation at present  4. Leukocytosis-secondary to pneumonia and sepsis. -Follow white cell count with IV antibiotic therapy.  Case discussed with Care Management/Social Worker. Management plans discussed with the patient, family and they are in agreement.  CODE STATUS: full  DVT Prophylaxis: lovenox  TOTAL TIME TAKING CARE OF THIS PATIENT: 40 minutes.  >50% time spent on counselling and coordination of care  POSSIBLE D/C IN 1-2 DAYS, DEPENDING ON CLINICAL CONDITION.  Note: This dictation was prepared with Dragon dictation along with smaller phrase technology. Any transcriptional errors that result from this process are unintentional.  Astrid Vides M.D on 04/07/2016 at 11:44 AM  Between 7am to 6pm - Pager - [phone]  After 6pm go to [email or web address] - password [email or web address] [facility]  Office  [phone]  CC: Primary care physician; Teodoro Spray., MD

## 2016-04-08 MED ORDER — AZITHROMYCIN 250 MG PO TABS: 250.0000 mg | ORAL_TABLET | Freq: Every day | ORAL | 0 refills | Status: DC

## 2016-04-08 MED ORDER — IBUPROFEN 200 MG PO TABS: 400.0000 mg | ORAL_TABLET | Freq: Two times a day (BID) | ORAL | 0 refills | Status: DC | PRN

## 2016-04-08 MED ORDER — CEFUROXIME AXETIL 500 MG PO TABS: 500.0000 mg | ORAL_TABLET | Freq: Two times a day (BID) | ORAL | Status: DC

## 2016-04-08 MED ORDER — DILTIAZEM HCL ER COATED BEADS 180 MG PO CP24
180.0000 mg | ORAL_CAPSULE | Freq: Every day | ORAL | Status: DC
  Administered 2016-04-08: 180 mg via ORAL
  Filled 2016-04-08: qty 1

## 2016-04-08 MED ORDER — IPRATROPIUM-ALBUTEROL 0.5-2.5 (3) MG/3ML IN SOLN: 3.0000 mL | Freq: Four times a day (QID) | RESPIRATORY_TRACT | Status: DC | PRN

## 2016-04-08 MED ORDER — GUAIFENESIN-DM 100-10 MG/5ML PO SYRP: 5.0000 mL | ORAL_SOLUTION | ORAL | 0 refills | Status: AC | PRN

## 2016-04-08 MED ORDER — IPRATROPIUM-ALBUTEROL 20-100 MCG/ACT IN AERS: 1.0000 | INHALATION_SPRAY | Freq: Four times a day (QID) | RESPIRATORY_TRACT | 0 refills | Status: DC | PRN

## 2016-04-08 MED ORDER — CEFUROXIME AXETIL 500 MG PO TABS: 500.0000 mg | ORAL_TABLET | Freq: Two times a day (BID) | ORAL | 0 refills | Status: DC

## 2016-04-08 MED ORDER — AZITHROMYCIN 250 MG PO TABS
250.0000 mg | ORAL_TABLET | Freq: Every day | ORAL | Status: DC
  Administered 2016-04-08: 250 mg via ORAL
  Filled 2016-04-08: qty 1

## 2016-04-08 MED ORDER — DILTIAZEM HCL ER COATED BEADS 180 MG PO CP24: 180.0000 mg | ORAL_CAPSULE | Freq: Every day | ORAL | 2 refills | Status: DC

## 2016-04-08 NOTE — Discharge Summary (Signed)
[place] at [place] NAME: Neil Garcia    MR#:  [id number]  DATE OF BIRTH:  06-29-1941  DATE OF ADMISSION:  04/06/2016 ADMITTING PHYSICIAN: Henreitta Leber, MD  DATE OF DISCHARGE: 04/08/16  PRIMARY CARE PHYSICIAN: Kirk Ruths., MD    ADMISSION DIAGNOSIS:  Community acquired pneumonia [J18.9] Atrial fibrillation with rapid ventricular response ([place]) [I48.91] Sepsis, due to unspecified organism ([place]) [A41.9]  DISCHARGE DIAGNOSIS:  Right middle lobe pneumonia Atrial fibrillation-new onset Morbid obesity DJD  SECONDARY DIAGNOSIS:   Past Medical History:  Diagnosis Date  . Atrial fibrillation ([place])   . Hypoglycemia   . Seasonal allergies     HOSPITAL COURSE:   75 year old male with past medical history of hypoglycemia who presents to the hospital shortness of breath, cough and blood-tinged sputum and noted to have pneumonia and also in A. fib with RVR.  1. Sepsis-patient met criteria on admission given his leukocytosis, elevated lactic acid, and chest x-ray findings suggestive of pneumonia. -cont IV ceftriaxone and Zithromax for pneumonia.---change to po abxs - BC negative  2. Pneumonia-Right ML community acquired. -Chest x-ray findings suggestive of right middle lobe pneumonia.  -on IV ceftriaxone, Zithromax---change to oral abxs -sats stable at room air  3. Atrial fibrillation with rapid ventricular response-patient presented to the hospital with heart rates in the 180s. This could be secondary to the underlying sepsis/pneumonia. -Place on oral Cardizem for rate control, also IV Cardizem as needed.  -Cardiology consult with dr Ubaldo Glassing appreciated. No need for chronic anticoagulation at present  4.Possible underlying OSA with morbid obesity -out pt sleep study defer to PCP  D/c home CONSULTS OBTAINED:  Treatment Team:  Teodoro Spray, MD  DRUG ALLERGIES:   Allergies  Allergen Reactions  . Prednisone  Other (See Comments)    Causes patient to become very nervous; insomnia  . Statins Other (See Comments)    Cramping and weak   . Celecoxib Rash and Other (See Comments)    Nervousness, hyper     DISCHARGE MEDICATIONS:   Current Discharge Medication List    START taking these medications   Details  azithromycin (ZITHROMAX) 250 MG tablet Take 1 tablet (250 mg total) by mouth daily. Take as directed daily Qty: 5 each, Refills: 0    cefUROXime (CEFTIN) 500 MG tablet Take 1 tablet (500 mg total) by mouth 2 (two) times daily with a meal. Qty: 14 tablet, Refills: 0    diltiazem (CARDIZEM CD) 180 MG 24 hr capsule Take 1 capsule (180 mg total) by mouth daily. Qty: 30 capsule, Refills: 2    guaiFENesin-dextromethorphan (ROBITUSSIN DM) 100-10 MG/5ML syrup Take 5 mLs by mouth every 4 (four) hours as needed for cough. Qty: 118 mL, Refills: 0    Ipratropium-Albuterol (COMBIVENT) 20-100 MCG/ACT AERS respimat Inhale 1 puff into the lungs every 6 (six) hours as needed for wheezing. Qty: 1 Inhaler, Refills: 0      CONTINUE these medications which have CHANGED   Details  ibuprofen (ADVIL,MOTRIN) 200 MG tablet Take 2 tablets (400 mg total) by mouth every 12 (twelve) hours as needed for moderate pain. Qty: 30 tablet, Refills: 0      CONTINUE these medications which have NOT CHANGED   Details  aspirin EC 81 MG tablet Take 162 mg by mouth once.    loratadine (CLARITIN) 10 MG tablet Take 10 mg by mouth daily.     terazosin (HYTRIN) 5 MG capsule Take 5 mg by mouth at bedtime.  If you experience worsening of your admission symptoms, develop shortness of breath, life threatening emergency, suicidal or homicidal thoughts you must seek medical attention immediately by calling 911 or calling your MD immediately  if symptoms less severe.  You Must read complete instructions/literature along with all the possible adverse reactions/side effects for all the Medicines you take and that have been  prescribed to you. Take any new Medicines after you have completely understood and accept all the possible adverse reactions/side effects.   Please note  You were cared for by a hospitalist during your hospital stay. If you have any questions about your discharge medications or the care you received while you were in the hospital after you are discharged, you can call the unit and asked to speak with the hospitalist on call if the hospitalist that took care of you is not available. Once you are discharged, your primary care physician will handle any further medical issues. Please note that NO REFILLS for any discharge medications will be authorized once you are discharged, as it is imperative that you return to your primary care physician (or establish a relationship with a primary care physician if you do not have one) for your aftercare needs so that they can reassess your need for medications and monitor your lab values. Today   SUBJECTIVE   Doing well  VITAL SIGNS:  Blood pressure 127/79, pulse 100, temperature 98.1 F (36.7 C), temperature source Oral, resp. rate 20, height 5' 11"  (1.803 m), weight 136.1 kg (300 lb), SpO2 94 %.  I/O:   Intake/Output Summary (Last 24 hours) at 04/08/16 0917 Last data filed at 04/08/16 0739  Gross per 24 hour  Intake              240 ml  Output             1100 ml  Net             -860 ml    PHYSICAL EXAMINATION:  GENERAL:  74 y.o.-year-old patient lying in the bed with no acute distress. obese EYES: Pupils equal, round, reactive to light and accommodation. No scleral icterus. Extraocular muscles intact.  HEENT: Head atraumatic, normocephalic. Oropharynx and nasopharynx clear.  NECK:  Supple, no jugular venous distention. No thyroid enlargement, no tenderness.  LUNGS: Normal breath sounds bilaterally, no wheezing, rales,rhonchi or crepitation. No use of accessory muscles of respiration.  CARDIOVASCULAR: S1, S2 normal. No murmurs, rubs, or gallops.  Irregular irregular rhythm ABDOMEN: Soft, non-tender, non-distended. Bowel sounds present. No organomegaly or mass.  EXTREMITIES: No pedal edema, cyanosis, or clubbing.  NEUROLOGIC: Cranial nerves II through XII are intact. Muscle strength 5/5 in all extremities. Sensation intact. Gait not checked.  PSYCHIATRIC: patient is alert and oriented x 3.  SKIN: No obvious rash, lesion, or ulcer.   DATA REVIEW:   CBC   Recent Labs Lab 04/07/16 0338  WBC 8.0  HGB 13.1  HCT 39.5*  PLT 151    Chemistries   Recent Labs Lab 04/06/16 0916 04/07/16 0338  NA 138 138  K 4.3 3.5  CL 104 104  CO2 26 27  GLUCOSE 98 110*  BUN 13 11  CREATININE 0.96 0.94  CALCIUM 8.9 7.9*  AST 27  --   ALT 21  --   ALKPHOS 67  --   BILITOT 0.7  --     Microbiology Results   Recent Results (from the past 240 hour(s))  Blood Culture (routine x 2)  Status: None (Preliminary result)   Collection Time: 04/06/16 10:37 AM  Result Value Ref Range Status   Specimen Description BLOOD RIGHT HAND  Final   Special Requests   Final    BOTTLES DRAWN AEROBIC AND ANAEROBIC  AEROBIC 13CC, ANAEROBOC 10CC   Culture NO GROWTH 2 DAYS  Final   Report Status PENDING  Incomplete  Blood Culture (routine x 2)     Status: None (Preliminary result)   Collection Time: 04/06/16 10:37 AM  Result Value Ref Range Status   Specimen Description BLOOD LEFT FOREARM  Final   Special Requests   Final    BOTTLES DRAWN AEROBIC AND ANAEROBIC  AEROBIC [place], ANAEROBIC [place]   Culture NO GROWTH 2 DAYS  Final   Report Status PENDING  Incomplete  Culture, expectorated sputum-assessment     Status: None   Collection Time: 04/06/16 12:36 PM  Result Value Ref Range Status   Specimen Description SPUTUM  Final   Special Requests Normal  Final   Sputum evaluation THIS SPECIMEN IS ACCEPTABLE FOR SPUTUM CULTURE  Final   Report Status 04/07/2016 FINAL  Final  Culture, respiratory (NON-Expectorated)     Status: None (Preliminary result)    Collection Time: 04/06/16 12:36 PM  Result Value Ref Range Status   Specimen Description SPUTUM  Final   Special Requests Normal Reflexed from [id number]  Final   Gram Stain   Final    ABUNDANT WBC PRESENT, PREDOMINANTLY PMN MODERATE GRAM POSITIVE COCCI IN CLUSTERS IN CHAINS FEW GRAM NEGATIVE DIPLOCOCCI FEW GRAM NEGATIVE COCCOBACILLI Performed at [facility]    Culture PENDING  Incomplete   Report Status PENDING  Incomplete  Urine culture     Status: Abnormal   Collection Time: 04/06/16  3:30 PM  Result Value Ref Range Status   Specimen Description URINE, RANDOM  Final   Special Requests NONE  Final   Culture (A)  Final    4,000 COLONIES/mL INSIGNIFICANT GROWTH Performed at [facility]    Report Status 04/07/2016 FINAL  Final    RADIOLOGY:  Dg Chest 2 View  Result Date: 04/06/2016 CLINICAL DATA:  Cough with congestion, shortness of breath EXAM: CHEST  2 VIEW COMPARISON:  None available FINDINGS: Asymmetric ill-defined patchy bronchovascular opacity in the right mid and lower lung on the frontal view which appears to be anterior on the lateral. This is suspicious for a right middle lobe bronchopneumonia. Mild cardiac enlargement without CHF or edema. No effusion or pneumothorax. Trachea is midline. Degenerative spondylosis of the spine. IMPRESSION: Findings suspicious for right middle lobe bronchopneumonia. Recommend short-term radiographic follow-up to document resolution. Mild cardiomegaly without CHF Electronically Signed   By: Jerilynn Mages.  Shick M.D.   On: 04/06/2016 09:51    Management plans discussed with the patient, family and they are in agreement.  CODE STATUS:     Code Status Orders        Start     Ordered   04/06/16 1630  Full code  Continuous     04/06/16 1629    Code Status History    Date Active Date Inactive Code Status Order ID Comments User Context   04/06/2016  1:57 PM 04/06/2016  2:38 PM Full Code [id number]  Henreitta Leber, MD Inpatient       TOTAL TIME TAKING CARE OF THIS PATIENT: 40 minutes.    Josehua Hammar M.D on 04/08/2016 at 9:17 AM  Between 7am to 6pm - Pager - [phone] After 6pm go to [email or web address] - password  [email or web address] [facility]  [place] [facility]  Office  [phone]  CC: Primary care physician; Kirk Ruths., MD

## 2016-04-08 NOTE — Progress Notes (Signed)
Patient discharged via wheelchair and private vehicle. IV removed and catheter intact. All discharge instructions given and patient verbalizes understanding. Tele removed and returned. No prescriptions given to patient No distress noted.   

## 2016-04-10 LAB — CULTURE, RESPIRATORY

## 2016-04-10 LAB — CULTURE, RESPIRATORY W GRAM STAIN
Culture: NORMAL
Special Requests: NORMAL

## 2016-04-11 LAB — CULTURE, BLOOD (ROUTINE X 2)
CULTURE: NO GROWTH
Culture: NO GROWTH

## 2016-05-28 ENCOUNTER — Encounter
Admission: RE | Disposition: A | Discharge status: Home / Self Care | Payer: Self-pay | Source: Ambulatory Visit | Attending: Cardiology

## 2016-05-28 ENCOUNTER — Encounter: Payer: Self-pay | Admitting: *Deleted

## 2016-05-28 ENCOUNTER — Ambulatory Visit: Payer: Medicare HMO | Admitting: Anesthesiology

## 2016-05-28 ENCOUNTER — Ambulatory Visit
Admission: RE | Admit: 2016-05-28 | Discharge: 2016-05-28 | Disposition: A | Discharge status: Home / Self Care | Payer: Medicare HMO | Source: Ambulatory Visit | Attending: Cardiology | Admitting: Cardiology

## 2016-05-28 DIAGNOSIS — Z87891 Personal history of nicotine dependence: Secondary | ICD-10-CM | POA: Insufficient documentation

## 2016-05-28 DIAGNOSIS — Z96642 Presence of left artificial hip joint: Secondary | ICD-10-CM | POA: Diagnosis not present

## 2016-05-28 DIAGNOSIS — Z7901 Long term (current) use of anticoagulants: Secondary | ICD-10-CM | POA: Insufficient documentation

## 2016-05-28 DIAGNOSIS — Z79899 Other long term (current) drug therapy: Secondary | ICD-10-CM | POA: Diagnosis not present

## 2016-05-28 DIAGNOSIS — J302 Other seasonal allergic rhinitis: Secondary | ICD-10-CM | POA: Diagnosis not present

## 2016-05-28 DIAGNOSIS — I4891 Unspecified atrial fibrillation: Secondary | ICD-10-CM | POA: Insufficient documentation

## 2016-05-28 DIAGNOSIS — E7800 Pure hypercholesterolemia, unspecified: Secondary | ICD-10-CM | POA: Insufficient documentation

## 2016-05-28 HISTORY — PX: ELECTROPHYSIOLOGIC STUDY: SHX172A

## 2016-05-28 HISTORY — DX: Cardiac arrhythmia, unspecified: I49.9

## 2016-05-28 SURGERY — CARDIOVERSION (CATH LAB)
Anesthesia: General

## 2016-05-28 MED ORDER — SODIUM CHLORIDE 0.9 % IV SOLN
INTRAVENOUS | Status: DC
  Administered 2016-05-28: 07:00:00 via INTRAVENOUS

## 2016-05-28 MED ORDER — PROPOFOL 10 MG/ML IV BOLUS
INTRAVENOUS | Status: DC | PRN
  Administered 2016-05-28: 80 mg via INTRAVENOUS
  Administered 2016-05-28: 10 mg via INTRAVENOUS

## 2016-05-28 MED ORDER — HYDROCORTISONE 1 % EX CREA
1.0000 "application " | TOPICAL_CREAM | Freq: Three times a day (TID) | CUTANEOUS | Status: DC | PRN
  Filled 2016-05-28: qty 28

## 2016-05-28 NOTE — Anesthesia Postprocedure Evaluation (Signed)
Anesthesia Post Note  Patient: Neil Garcia  Procedure(s) Performed: Procedure(s) (LRB): CARDIOVERSION (N/A)  Patient location during evaluation: Other Anesthesia Type: General Level of consciousness: awake and alert Pain management: pain level controlled Vital Signs Assessment: post-procedure vital signs reviewed and stable Respiratory status: spontaneous breathing and respiratory function stable Cardiovascular status: stable Anesthetic complications: no    Last Vitals:  Vitals:   05/28/16 0754 05/28/16 0800  BP: (!) 151/83 (!) 148/80  Pulse:  92  Resp: 18 13  Temp:      Last Pain:  Vitals:   05/28/16 0718  TempSrc: Oral                 KEPHART,WILLIAM K

## 2016-05-28 NOTE — Transfer of Care (Signed)
Immediate Anesthesia Transfer of Care Note  Patient: Neil Garcia  Procedure(s) Performed: Procedure(s): CARDIOVERSION (N/A)  Patient Location: PACU and Short Stay  Anesthesia Type:General  Level of Consciousness: awake, alert  and oriented  Airway & Oxygen Therapy: Patient Spontanous Breathing and Patient connected to nasal cannula oxygen  Post-op Assessment: Report given to RN and Post -op Vital signs reviewed and stable  Post vital signs: Reviewed and stable  Last Vitals:  Vitals:   05/28/16 0753 05/28/16 0754  BP:  (!) 151/83  Pulse: 95   Resp: (!) 22 18  Temp:      Complications: No apparent anesthesia complications

## 2016-05-28 NOTE — CV Procedure (Signed)
DC Cardioversion  Indication: symptomatic afib Sedation: per dept of anesthesia  After informed consent, time out protocol and adequate sedaiton pt received 120J, 150J and 200 J of synchronized Biphasic DC cardioversion. He did not convert to sinus rhythm. No immediate complications.

## 2016-05-28 NOTE — Anesthesia Preprocedure Evaluation (Addendum)
Anesthesia Evaluation  Patient identified by MRN, date of birth, ID band Patient awake    Reviewed: Allergy & Precautions, NPO status , Patient's Chart, lab work & pertinent test results  History of Anesthesia Complications Negative for: history of anesthetic complications  Airway Mallampati: III       Dental   Pulmonary former smoker,           Cardiovascular + dysrhythmias Atrial Fibrillation      Neuro/Psych negative neurological ROS     GI/Hepatic negative GI ROS, Neg liver ROS,   Endo/Other  negative endocrine ROS  Renal/GU negative Renal ROS     Musculoskeletal   Abdominal   Peds  Hematology negative hematology ROS (+)   Anesthesia Other Findings   Reproductive/Obstetrics                           Anesthesia Physical Anesthesia Plan  ASA: III  Anesthesia Plan: General   Post-op Pain Management:    Induction: Intravenous  Airway Management Planned: Mask  Additional Equipment:   Intra-op Plan:   Post-operative Plan:   Informed Consent: I have reviewed the patients History and Physical, chart, labs and discussed the procedure including the risks, benefits and alternatives for the proposed anesthesia with the patient or authorized representative who has indicated his/her understanding and acceptance.     Plan Discussed with:   Anesthesia Plan Comments:         Anesthesia Quick Evaluation

## 2016-05-28 NOTE — Anesthesia Procedure Notes (Signed)
Date/Time: 05/28/2016 7:40 AM Performed by: Doreen Salvage Pre-anesthesia Checklist: Patient identified, Emergency Drugs available, Suction available and Patient being monitored Patient Re-evaluated:Patient Re-evaluated prior to inductionOxygen Delivery Method: Nasal cannula Intubation Type: IV induction Dental Injury: Teeth and Oropharynx as per pre-operative assessment  Comments: Nasal cannula with etCO2 monitoring

## 2016-05-31 ENCOUNTER — Encounter: Payer: Self-pay | Admitting: Cardiology

## 2016-06-01 ENCOUNTER — Observation Stay
Admission: AD | Admit: 2016-06-01 | Discharge: 2016-06-02 | Disposition: A | Discharge status: Home / Self Care | Payer: Medicare HMO | Source: Ambulatory Visit | Attending: Cardiology | Admitting: Cardiology

## 2016-06-01 DIAGNOSIS — Z9660 Presence of unspecified orthopedic joint implant: Secondary | ICD-10-CM | POA: Insufficient documentation

## 2016-06-01 DIAGNOSIS — Z820 Family history of epilepsy and other diseases of the nervous system: Secondary | ICD-10-CM | POA: Diagnosis not present

## 2016-06-01 DIAGNOSIS — Z86010 Personal history of colonic polyps: Secondary | ICD-10-CM | POA: Insufficient documentation

## 2016-06-01 DIAGNOSIS — E669 Obesity, unspecified: Secondary | ICD-10-CM | POA: Diagnosis not present

## 2016-06-01 DIAGNOSIS — Z811 Family history of alcohol abuse and dependence: Secondary | ICD-10-CM | POA: Diagnosis not present

## 2016-06-01 DIAGNOSIS — Z79899 Other long term (current) drug therapy: Secondary | ICD-10-CM | POA: Insufficient documentation

## 2016-06-01 DIAGNOSIS — Z8379 Family history of other diseases of the digestive system: Secondary | ICD-10-CM | POA: Diagnosis not present

## 2016-06-01 DIAGNOSIS — M1990 Unspecified osteoarthritis, unspecified site: Secondary | ICD-10-CM | POA: Insufficient documentation

## 2016-06-01 DIAGNOSIS — Z888 Allergy status to other drugs, medicaments and biological substances status: Secondary | ICD-10-CM | POA: Insufficient documentation

## 2016-06-01 DIAGNOSIS — Z87891 Personal history of nicotine dependence: Secondary | ICD-10-CM | POA: Insufficient documentation

## 2016-06-01 DIAGNOSIS — I4891 Unspecified atrial fibrillation: Secondary | ICD-10-CM | POA: Diagnosis present

## 2016-06-01 DIAGNOSIS — Z6841 Body Mass Index (BMI) 40.0 and over, adult: Secondary | ICD-10-CM | POA: Insufficient documentation

## 2016-06-01 DIAGNOSIS — I480 Paroxysmal atrial fibrillation: Principal | ICD-10-CM | POA: Insufficient documentation

## 2016-06-01 DIAGNOSIS — E785 Hyperlipidemia, unspecified: Secondary | ICD-10-CM | POA: Insufficient documentation

## 2016-06-01 DIAGNOSIS — Z808 Family history of malignant neoplasm of other organs or systems: Secondary | ICD-10-CM | POA: Diagnosis not present

## 2016-06-01 LAB — CBC WITH DIFFERENTIAL/PLATELET
BASOS ABS: 0.1 10*3/uL (ref 0–0.1)
Basophils Relative: 1 %
Eosinophils Absolute: 0.2 10*3/uL (ref 0–0.7)
Eosinophils Relative: 2 %
HEMATOCRIT: 41.3 % (ref 40.0–52.0)
HEMOGLOBIN: 14.4 g/dL (ref 13.0–18.0)
LYMPHS ABS: 1.8 10*3/uL (ref 1.0–3.6)
LYMPHS PCT: 23 %
MCH: 28.6 pg (ref 26.0–34.0)
MCHC: 35 g/dL (ref 32.0–36.0)
MCV: 81.7 fL (ref 80.0–100.0)
Monocytes Absolute: 0.5 10*3/uL (ref 0.2–1.0)
Monocytes Relative: 6 %
NEUTROS ABS: 5.2 10*3/uL (ref 1.4–6.5)
Neutrophils Relative %: 68 %
Platelets: 187 10*3/uL (ref 150–440)
RBC: 5.05 MIL/uL (ref 4.40–5.90)
RDW: 13.9 % (ref 11.5–14.5)
WBC: 7.7 10*3/uL (ref 3.8–10.6)

## 2016-06-01 LAB — BASIC METABOLIC PANEL
Anion gap: 3 — ABNORMAL LOW (ref 5–15)
BUN: 20 mg/dL (ref 6–20)
CHLORIDE: 106 mmol/L (ref 101–111)
CO2: 28 mmol/L (ref 22–32)
CREATININE: 1.07 mg/dL (ref 0.61–1.24)
Calcium: 8.8 mg/dL — ABNORMAL LOW (ref 8.9–10.3)
GFR calc non Af Amer: >60 mL/min (ref >60)
Glucose, Bld: 102 mg/dL — ABNORMAL HIGH (ref 65–99)
POTASSIUM: 3.6 mmol/L (ref 3.5–5.1)
SODIUM: 137 mmol/L (ref 135–145)

## 2016-06-01 LAB — MAGNESIUM: MAGNESIUM: 1.9 mg/dL (ref 1.7–2.4)

## 2016-06-01 MED ORDER — SODIUM CHLORIDE 0.9 % IV SOLN: 250.0000 mL | INTRAVENOUS | Status: DC

## 2016-06-01 MED ORDER — TERAZOSIN HCL 2 MG PO CAPS
5.0000 mg | ORAL_CAPSULE | Freq: Every day | ORAL | Status: DC
  Administered 2016-06-01: 5 mg via ORAL
  Filled 2016-06-01: qty 1

## 2016-06-01 MED ORDER — SOTALOL HCL 80 MG PO TABS
80.0000 mg | ORAL_TABLET | Freq: Three times a day (TID) | ORAL | Status: DC
  Administered 2016-06-01 – 2016-06-02 (×4): 80 mg via ORAL
  Filled 2016-06-01 (×6): qty 1

## 2016-06-01 MED ORDER — ONDANSETRON HCL 4 MG/2ML IJ SOLN: 4.0000 mg | Freq: Four times a day (QID) | INTRAMUSCULAR | Status: DC | PRN

## 2016-06-01 MED ORDER — SODIUM CHLORIDE 0.9 % IV SOLN
250.0000 mL | INTRAVENOUS | Status: DC | PRN
  Administered 2016-06-02: 12:00:00 via INTRAVENOUS

## 2016-06-01 MED ORDER — SODIUM CHLORIDE 0.9% FLUSH
3.0000 mL | Freq: Two times a day (BID) | INTRAVENOUS | Status: DC
  Administered 2016-06-01: 3 mL via INTRAVENOUS

## 2016-06-01 MED ORDER — ACETAMINOPHEN 325 MG PO TABS: 650.0000 mg | ORAL_TABLET | ORAL | Status: DC | PRN

## 2016-06-01 MED ORDER — SODIUM CHLORIDE 0.9% FLUSH: 3.0000 mL | INTRAVENOUS | Status: DC | PRN

## 2016-06-01 MED ORDER — APIXABAN 5 MG PO TABS
5.0000 mg | ORAL_TABLET | Freq: Two times a day (BID) | ORAL | Status: DC
  Administered 2016-06-01: 5 mg via ORAL
  Filled 2016-06-01: qty 1

## 2016-06-01 NOTE — H&P (Signed)
Chief Complaint: Chief Complaint  Patient presents with  . Follow-up  3 weeks  Date of Service: 05/21/2016 Date of Birth: 07-20-1941 PCP: Caleen Essex, III, MD  History of Present Illness: Neil Garcia is a 75 y.o.male patient who presents in follow-up after hospitalization with pneumonia where he was diagnosed with new onset atrial fibrillation. He was rate controlled with diltiazem. He has been placed on apixaban and has been treated with this for greater than 4 weeks.. During the hospitalization he was noted to have new onset atrial fibrillation. Echocardiogram revealed normal LV function with EF 55-60% with mild left atrial enlargement. There is no high-grade valvular abnormalities. His rate has been controlled since discharge. EKG today shows persistent atrial fibrillation at a rate of 87 with a QRS duration 92 ms and a QTC of 469 ms. Past Medical and Surgical History  Past Medical History Past Medical History:  Diagnosis Date  . Atrial fibrillation (CMS-HCC)  . Hyperlipidemia  . Hyperplastic colon polyp 02/07/2015  . OA (osteoarthritis)  . Seasonal allergies  . 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   Medications and Allergies  Current Medications  Current Outpatient Prescriptions  Medication Sig Dispense Refill  . apixaban (ELIQUIS) 5 mg tablet Take 1 tablet (5 mg total) by mouth every 12 (twelve) hours. 60 tablet 0  . diltiazem (CARDIZEM CD) 180 MG CD capsule Take 1 capsule (180 mg total) by mouth once daily. 90 capsule 3  . ibuprofen (ADVIL,MOTRIN) 200 MG tablet Take 200 mg by mouth every 6 (six) hours as needed for Pain.  Marland Kitchen loratadine (CLARITIN) 10 mg tablet Take 10 mg by mouth once daily.  Marland Kitchen terazosin (HYTRIN) 5 MG capsule TAKE ONE CAPSULE BY MOUTH IN THE EVENING 90 capsule 2   No current  facility-administered medications for this visit.   Allergies: Celebrex [celecoxib]; Prednisone; and Statins-hmg-coa reductase inhibitors  Social and Family History  Social History reports that he quit smoking about 32 years ago. He smoked 1.00 pack per day. He has quit using smokeless tobacco. He reports that he does not drink alcohol or use illicit drugs.  Family History Family History  Problem Relation Age of Onset  . Alzheimer's disease Mother  . Brain cancer Father  . Alcohol abuse Father  . Hepatitis Brother   Review of Systems  Review of Systems  Constitutional: Negative for chills, diaphoresis, fever, malaise/fatigue and weight loss.  HENT: Negative for congestion, ear discharge, hearing loss and tinnitus.  Eyes: Negative for blurred vision.  Respiratory: Positive for shortness of breath. Negative for cough, hemoptysis, sputum production and wheezing.  Cardiovascular: Negative for chest pain, palpitations, orthopnea, claudication, leg swelling and PND.  Gastrointestinal: Negative for abdominal pain, blood in stool, constipation, diarrhea, heartburn, melena, nausea and vomiting.  Genitourinary: Negative for dysuria, frequency, hematuria and urgency.  Musculoskeletal: Negative for back pain, falls, joint pain and myalgias.  Skin: Negative for itching and rash.  Neurological: Negative for dizziness, tingling, focal weakness, loss of consciousness, weakness and headaches.  Endo/Heme/Allergies: Negative for polydipsia. Does not bruise/bleed easily.  Psychiatric/Behavioral: Negative for depression, memory loss and substance abuse. The patient is not nervous/anxious.   Physical Examination   Vitals:BP 142/74 (BP Location: Left upper arm, Patient Position: Sitting, BP Cuff Size: Large Adult)  Pulse 81  Ht 180.3 cm (5\' 11" )  Wt (!) 140.6 kg (310 lb)  SpO2 97%  BMI 43.24 kg/m2 Ht:180.3 cm (5\' 11" ) Wt:(!) 140.6 kg (310 lb) [id number] surface area is 2.65 meters squared. Body mass  index is 43.24 kg/(m^2).  Wt Readings from Last 3 Encounters:  05/21/16 (!) 140.6 kg (310 lb)  04/30/16 (!) 140.4 kg (309 lb 9.6 oz)  04/12/16 (!) 139.3 kg (307 lb)   BP Readings from Last 3 Encounters:  05/21/16 142/74  04/30/16 130/86  04/12/16 128/72   General appearance appears in no acute distress  Head Mouth and Eye exam Normocephalic, without obvious abnormality, atraumatic Dentition is good Eyes appear anicteric   Neck exam Thyroid: normal  Nodes: no obvious adenopathy  LUNGS Breath Sounds: Normal Percussion: Normal  CARDIOVASCULAR JVP CV wave: no HJR: no Elevation at 90 degrees: None Carotid Pulse: normal pulsation bilaterally Bruit: None Apex: apical impulse normal  Auscultation Rhythm: atrial fibrillation S1: normal S2: normal Clicks: no Rub: no Murmurs: no murmurs  Gallop: None ABDOMEN Liver enlargement: no Pulsatile aorta: no Ascites: no Bruits: no  EXTREMITIES Clubbing: no Edema: trace to 1+ bilateral pedal edema Pulses: peripheral pulses symmetrical Femoral Bruits: no Amputation: no SKIN Rash: no Cyanosis: no Embolic phemonenon: no Bruising: no NEURO Alert and Oriented to person, place and time: yes Non focal: yes  PSYCH: Pt appears to have normal affect  LABS REVIEWED Last 3 CBC results: Lab Results  Component Value Date  WBC 7.7 07/15/2014   Lab Results  Component Value Date  HGB 13.9 (L) 07/15/2014   Lab Results  Component Value Date  HCT 39.6 (L) 07/15/2014   Lab Results  Component Value Date  PLT 183 07/15/2014   Lab Results  Component Value Date  CREATININE 1.0 05/21/2016  BUN 17 05/21/2016  NA 138 05/21/2016  K 3.9 05/21/2016  CL 102 05/21/2016  CO2 26.9 05/21/2016   Lab Results  Component Value Date  ALT 21 07/15/2014  AST 20 07/15/2014  ALKPHOS 62 07/15/2014   Lab Results  Component Value Date  TSH 3.265 07/15/2014   Diagnostic Studies Reviewed:  EKG EKG demonstrated nonspecific ST  and T waves changes, atrial fibrillation, rate 87.  Assessment and Plan   75 y.o. male with  ICD-10-CM ICD-9-CM  1. Encounter to establish care-EKG shows atrial fibrillation with controlled ventricular response. There is no significant ischemia. Rate is well controlled. Z76.89 V65.8 ECG 12-lead  2. Atrial fibrillation, unspecified type (CMS-HCC)-etiology is unclear. Rate is fairly well-controlled with 180 mg daily. Patient failed cardoversion.  Continue with apixaban at 5 mg twice daily. He has completed 4 weeks of anticoagulation. We will proceed with admission for loading with betapace followed by  cardioversion the next morning next week. I48.91 427.31   3. New onset a-fib (CMS-HCC)-as per above I48.91 427.31   4. Pure hypercholesterolemia and low-fat low-cholesterol diet with an LDL goal of 130 or less E78.00 272.0   Return in about 1 week (around 05/28/2016).  These notes generated with voice recognition software. I apologize for typographical errors.  Sydnee Levans, MD  H and P unchanged from above.

## 2016-06-02 ENCOUNTER — Observation Stay: Payer: Medicare HMO | Admitting: Certified Registered"

## 2016-06-02 ENCOUNTER — Inpatient Hospital Stay: Admission: RE | Admit: 2016-06-02 | Payer: Medicare HMO | Source: Ambulatory Visit | Admitting: Cardiology

## 2016-06-02 ENCOUNTER — Other Ambulatory Visit: Payer: Self-pay

## 2016-06-02 ENCOUNTER — Encounter: Payer: Self-pay | Admitting: Cardiology

## 2016-06-02 ENCOUNTER — Encounter
Admission: AD | Disposition: A | Discharge status: Home / Self Care | Payer: Self-pay | Source: Ambulatory Visit | Attending: Cardiology

## 2016-06-02 DIAGNOSIS — I480 Paroxysmal atrial fibrillation: Secondary | ICD-10-CM | POA: Diagnosis not present

## 2016-06-02 HISTORY — PX: ELECTROPHYSIOLOGIC STUDY: SHX172A

## 2016-06-02 LAB — BASIC METABOLIC PANEL
Anion gap: 6 (ref 5–15)
BUN: 19 mg/dL (ref 6–20)
CALCIUM: 8.4 mg/dL — AB (ref 8.9–10.3)
CO2: 26 mmol/L (ref 22–32)
CREATININE: 0.93 mg/dL (ref 0.61–1.24)
Chloride: 107 mmol/L (ref 101–111)
GFR calc Af Amer: >60 mL/min (ref >60)
Glucose, Bld: 104 mg/dL — ABNORMAL HIGH (ref 65–99)
POTASSIUM: 3.7 mmol/L (ref 3.5–5.1)
SODIUM: 139 mmol/L (ref 135–145)

## 2016-06-02 SURGERY — CARDIOVERSION (CATH LAB)
Anesthesia: General

## 2016-06-02 MED ORDER — SODIUM CHLORIDE 0.9% FLUSH: 3.0000 mL | INTRAVENOUS | Status: DC | PRN

## 2016-06-02 MED ORDER — HYDROCORTISONE 1 % EX CREA
1.0000 "application " | TOPICAL_CREAM | Freq: Three times a day (TID) | CUTANEOUS | Status: DC | PRN
  Filled 2016-06-02: qty 28

## 2016-06-02 MED ORDER — SODIUM CHLORIDE 0.9 % IV SOLN: 250.0000 mL | INTRAVENOUS | Status: DC

## 2016-06-02 MED ORDER — PROPOFOL 10 MG/ML IV BOLUS
INTRAVENOUS | Status: DC | PRN
  Administered 2016-06-02: 10 mg via INTRAVENOUS
  Administered 2016-06-02: 60 mg via INTRAVENOUS

## 2016-06-02 MED ORDER — SODIUM CHLORIDE 0.9% FLUSH: 3.0000 mL | Freq: Two times a day (BID) | INTRAVENOUS | Status: DC

## 2016-06-02 MED ORDER — SOTALOL HCL 80 MG PO TABS: 80.0000 mg | ORAL_TABLET | Freq: Two times a day (BID) | ORAL | 6 refills | Status: DC

## 2016-06-02 NOTE — Procedures (Signed)
Cardioversion  Indication Symptomatic afib Sedation: Moderate sedation per dept of anesthesia  After informed consent, time out protocol and adequate sedation pt received a 150 J synchronized DC Biphasic shock with conversion to nsr. No immediate complications.

## 2016-06-02 NOTE — Discharge Summary (Signed)
   Physician Discharge Summary  Patient ID: Neil Garcia MRN: [id number] DOB/AGE: 1941/04/05 75 y.o.  Admit date: 06/01/2016 Discharge date: 06/02/2016  Primary Discharge Diagnosis afib Secondary Discharge Diagnosis afib  Significant Diagnostic Studies:   Consults: None  Hospital Course: Pt placed in observation for loading with sotolol prior to cardioversion. He tolerated to load with no significant qtc prolongation. He was anticoagulated with apixaban. He underwent dccv this am with conversion to nsr. No immediate complications. Will ambulate and discharge on sotolol 80 mg bid and stop his cardizem. Continue with other nome meds including apixaban 5 mg bid.    Discharge Exam: Blood pressure (!) 99/49, pulse 89, temperature 98.1 F (36.7 C), temperature source Oral, resp. rate 20, height 5\' 11"  (1.803 m), weight (!) 138.3 kg (304 lb 14.4 oz), SpO2 99 %.    General appearance: alert and cooperative Head: Normocephalic, without obvious abnormality, atraumatic Resp: clear to auscultation bilaterally Chest wall: tenderness where cardioversion pads were. Cardio: regular rate and rhythm GI: soft, non-tender; bowel sounds normal; no masses,  no organomegaly Neurologic: Grossly normal Labs:   Lab Results  Component Value Date   WBC 7.7 06/01/2016   HGB 14.4 06/01/2016   HCT 41.3 06/01/2016   MCV 81.7 06/01/2016   PLT 187 06/01/2016    Recent Labs Lab 06/02/16 0536  NA 139  K 3.7  CL 107  CO2 26  BUN 19  CREATININE 0.93  CALCIUM 8.4*  GLUCOSE 104*       EKG: afib on admission. Currently in nas.   FOLLOW UP PLANS AND APPOINTMENTS    Medication List    STOP taking these medications   diltiazem 180 MG 24 hr capsule Commonly known as:  CARDIZEM CD     TAKE these medications   aspirin EC 81 MG tablet Take 162 mg by mouth once.   ELIQUIS 5 MG Tabs tablet Generic drug:  apixaban Take 5 mg by mouth 2 (two) times daily.   guaiFENesin-dextromethorphan 100-10  MG/5ML syrup Commonly known as:  ROBITUSSIN DM Take 5 mLs by mouth every 4 (four) hours as needed for cough.   ibuprofen 200 MG tablet Commonly known as:  ADVIL,MOTRIN Take 2 tablets (400 mg total) by mouth every 12 (twelve) hours as needed for moderate pain.   loratadine 10 MG tablet Commonly known as:  CLARITIN Take 10 mg by mouth daily.   sotalol 80 MG tablet Commonly known as:  BETAPACE Take 1 tablet (80 mg total) by mouth 2 (two) times daily.   terazosin 5 MG capsule Commonly known as:  HYTRIN Take 5 mg by mouth at bedtime.      Follow-up Information    Nassim Cosma A., MD Follow up in 1 week(s).   Specialty:  Cardiology Contact information: [place] [address] [phone]           BRING ALL MEDICATIONS WITH YOU TO FOLLOW UP APPOINTMENTS  Time spent with patient to include physician time: 30 min Signed:  Teodoro Spray MD, [facility] 06/02/2016, 1:34 PM

## 2016-06-02 NOTE — Progress Notes (Signed)
Patient ambulated in hallway around nursing station.  Remains in NSR, rate between 60-80 with ambulation.  Occasional PVCs.  Patient is pain free.  Family at bedside.  D/C telemetry and d/c PIV.  Given Rx and educated patient on new medication.  F/U appt made with Dr. Ubaldo Glassing for Thursday.  No questions at this time.  Patient to be escorted out of hospital via wheelchair by volunteers.

## 2016-06-02 NOTE — Anesthesia Preprocedure Evaluation (Signed)
Anesthesia Evaluation  Patient identified by MRN, date of birth, ID band Patient awake    Reviewed: Allergy & Precautions, NPO status , Patient's Chart, lab work & pertinent test results  History of Anesthesia Complications Negative for: history of anesthetic complications  Airway Mallampati: III  TM Distance: >3 FB Neck ROM: Full    Dental  (+) Upper Dentures, Lower Dentures   Pulmonary neg sleep apnea, neg COPD, former smoker,    breath sounds clear to auscultation- rhonchi (-) wheezing      Cardiovascular (-) hypertension(-) CAD and (-) Past MI + dysrhythmias Atrial Fibrillation  Rhythm:Regular Rate:Normal - Systolic murmurs and - Diastolic murmurs    Neuro/Psych negative neurological ROS  negative psych ROS   GI/Hepatic negative GI ROS, Neg liver ROS,   Endo/Other  negative endocrine ROSneg diabetes  Renal/GU negative Renal ROS     Musculoskeletal negative musculoskeletal ROS (+)   Abdominal (+) + obese,   Peds  Hematology negative hematology ROS (+)   Anesthesia Other Findings Past Medical History: No date: Atrial fibrillation (HCC) No date: Dysrhythmia No date: Hypoglycemia No date: Seasonal allergies   Reproductive/Obstetrics                             Anesthesia Physical Anesthesia Plan  ASA: III  Anesthesia Plan: General   Post-op Pain Management:    Induction: Intravenous  Airway Management Planned: Natural Airway  Additional Equipment:   Intra-op Plan:   Post-operative Plan:   Informed Consent: I have reviewed the patients History and Physical, chart, labs and discussed the procedure including the risks, benefits and alternatives for the proposed anesthesia with the patient or authorized representative who has indicated his/her understanding and acceptance.   Dental advisory given  Plan Discussed with: Anesthesiologist and CRNA  Anesthesia Plan Comments:          Anesthesia Quick Evaluation

## 2016-06-02 NOTE — Transfer of Care (Signed)
Immediate Anesthesia Transfer of Care Note  Patient: Neil Garcia  Procedure(s) Performed: Procedure(s): CARDIOVERSION (N/A)  Patient Location: Radiology  Anesthesia Type:General  Level of Consciousness: awake, oriented and patient cooperative  Airway & Oxygen Therapy: Patient Spontanous Breathing and Patient connected to nasal cannula oxygen  Post-op Assessment: Report given to RN, Post -op Vital signs reviewed and stable and Patient moving all extremities X 4  Post vital signs: Reviewed and stable  Last Vitals:  Vitals:   06/02/16 1148 06/02/16 1225  BP: 114/84 110/84  Pulse: 90 (!) 52  Resp: 20 18  Temp: 36.7 C     Last Pain:  Vitals:   06/02/16 1148  TempSrc: Oral         Complications: No apparent anesthesia complications

## 2016-06-02 NOTE — Progress Notes (Signed)
Pt clinically stable post cardioversion to sinus rhythm. Vss, awake,alert and oriented, family at bedside. Dr Ubaldo Glassing speaking with pt/family, report called to Amy RN with plan reviewed, denies complaints, taking po';s without difficulty

## 2016-06-02 NOTE — Anesthesia Postprocedure Evaluation (Signed)
Anesthesia Post Note  Patient: Neil Garcia  Procedure(s) Performed: Procedure(s) (LRB): CARDIOVERSION (N/A)  Patient location during evaluation: Other Anesthesia Type: General Level of consciousness: awake and alert Pain management: pain level controlled Vital Signs Assessment: post-procedure vital signs reviewed and stable Respiratory status: spontaneous breathing, nonlabored ventilation and respiratory function stable Cardiovascular status: blood pressure returned to baseline and stable Postop Assessment: no signs of nausea or vomiting Anesthetic complications: no    Last Vitals:  Vitals:   06/02/16 1227 06/02/16 1235  BP:  110/75  Pulse: (!) 34 62  Resp: 19 20  Temp:      Last Pain:  Vitals:   06/02/16 1148  TempSrc: Oral                 Consuelo Thayne

## 2016-06-20 NOTE — CV Procedure (Signed)
Cardioversion  INdication: symptomatic afib SedationPerdept of anesthesia  After informed consent, time out protocol and adequate sedation, pt received a biphasic dc cardioverson at 120 J to nsr. No immediate complications.

## 2016-12-14 ENCOUNTER — Ambulatory Visit: Payer: Medicare HMO | Admitting: Anesthesiology

## 2016-12-14 ENCOUNTER — Encounter
Admission: RE | Disposition: A | Discharge status: Home / Self Care | Payer: Self-pay | Source: Ambulatory Visit | Attending: Cardiology

## 2016-12-14 ENCOUNTER — Ambulatory Visit
Admission: RE | Admit: 2016-12-14 | Discharge: 2016-12-14 | Disposition: A | Discharge status: Home / Self Care | Payer: Medicare HMO | Source: Ambulatory Visit | Attending: Cardiology | Admitting: Cardiology

## 2016-12-14 DIAGNOSIS — E785 Hyperlipidemia, unspecified: Secondary | ICD-10-CM | POA: Insufficient documentation

## 2016-12-14 DIAGNOSIS — Z79899 Other long term (current) drug therapy: Secondary | ICD-10-CM | POA: Diagnosis not present

## 2016-12-14 DIAGNOSIS — Z7901 Long term (current) use of anticoagulants: Secondary | ICD-10-CM | POA: Insufficient documentation

## 2016-12-14 DIAGNOSIS — Z7952 Long term (current) use of systemic steroids: Secondary | ICD-10-CM | POA: Diagnosis not present

## 2016-12-14 DIAGNOSIS — Z6841 Body Mass Index (BMI) 40.0 and over, adult: Secondary | ICD-10-CM | POA: Diagnosis not present

## 2016-12-14 DIAGNOSIS — J302 Other seasonal allergic rhinitis: Secondary | ICD-10-CM | POA: Insufficient documentation

## 2016-12-14 DIAGNOSIS — E7800 Pure hypercholesterolemia, unspecified: Secondary | ICD-10-CM | POA: Insufficient documentation

## 2016-12-14 DIAGNOSIS — I4891 Unspecified atrial fibrillation: Secondary | ICD-10-CM | POA: Insufficient documentation

## 2016-12-14 DIAGNOSIS — Z96642 Presence of left artificial hip joint: Secondary | ICD-10-CM | POA: Diagnosis not present

## 2016-12-14 DIAGNOSIS — Z87891 Personal history of nicotine dependence: Secondary | ICD-10-CM | POA: Diagnosis not present

## 2016-12-14 HISTORY — PX: CARDIOVERSION: EP1203

## 2016-12-14 SURGERY — CARDIOVERSION (CATH LAB)
Anesthesia: General

## 2016-12-14 SURGERY — CARDIOVERSION (CATH LAB)
Anesthesia: Choice

## 2016-12-14 MED ORDER — SODIUM CHLORIDE 0.9 % IV SOLN
250.0000 mL | INTRAVENOUS | Status: DC
  Administered 2016-12-14: 250 mL via INTRAVENOUS

## 2016-12-14 MED ORDER — SODIUM CHLORIDE 0.9% FLUSH: 3.0000 mL | INTRAVENOUS | Status: DC | PRN

## 2016-12-14 MED ORDER — PROPOFOL 10 MG/ML IV BOLUS
INTRAVENOUS | Status: AC
  Filled 2016-12-14: qty 20

## 2016-12-14 MED ORDER — HYDROCORTISONE 1 % EX CREA
1.0000 "application " | TOPICAL_CREAM | Freq: Three times a day (TID) | CUTANEOUS | Status: DC | PRN
  Filled 2016-12-14: qty 28

## 2016-12-14 MED ORDER — SODIUM CHLORIDE 0.9% FLUSH: 3.0000 mL | Freq: Two times a day (BID) | INTRAVENOUS | Status: DC

## 2016-12-14 MED ORDER — PROPOFOL 10 MG/ML IV BOLUS
INTRAVENOUS | Status: DC | PRN
  Administered 2016-12-14: 70 mg via INTRAVENOUS

## 2016-12-14 NOTE — Transfer of Care (Signed)
Immediate Anesthesia Transfer of Care Note  Patient: Neil Garcia  Procedure(s) Performed: Procedure(s): CARDIOVERSION (N/A)  Patient Location: PACU  Anesthesia Type:General  Level of Consciousness: sedated and responds to stimulation  Airway & Oxygen Therapy: Patient Spontanous Breathing and Patient connected to nasal cannula oxygen  Post-op Assessment: Report given to RN and Post -op Vital signs reviewed and stable  Post vital signs: Reviewed and stable  Last Vitals:  Vitals:   12/14/16 0736 12/14/16 0737  BP: (!) 125/103 (!) 125/103  Pulse:  (!) 57  Resp: (!) 29 16  Temp:      Last Pain:  Vitals:   12/14/16 0640  TempSrc: Oral         Complications: No apparent anesthesia complications

## 2016-12-14 NOTE — Anesthesia Post-op Follow-up Note (Cosign Needed)
Anesthesia QCDR form completed.        

## 2016-12-14 NOTE — Anesthesia Postprocedure Evaluation (Signed)
Anesthesia Post Note  Patient: Neil Garcia  Procedure(s) Performed: Procedure(s) (LRB): CARDIOVERSION (N/A)  Patient location during evaluation: Endoscopy Anesthesia Type: General Level of consciousness: awake and alert Pain management: pain level controlled Vital Signs Assessment: post-procedure vital signs reviewed and stable Respiratory status: spontaneous breathing, nonlabored ventilation, respiratory function stable and patient connected to nasal cannula oxygen Cardiovascular status: blood pressure returned to baseline and stable Postop Assessment: no signs of nausea or vomiting Anesthetic complications: no     Last Vitals:  Vitals:   12/14/16 0751 12/14/16 0812  BP: 126/66 120/70  Pulse: (!) 56 (!) 58  Resp: 13 16  Temp:      Last Pain:  Vitals:   12/14/16 0640  TempSrc: Oral                 [name] S

## 2016-12-14 NOTE — H&P (Signed)
Chief Complaint: Chief Complaint  Patient presents with  . Follow-up  4 week recheck, possible cardioversion?  . Fatigue  tired from A-fib  . Dizziness  some lightheadedness from a-fib  . Edema  feet has been tingling and burning (like nerve pain)- when sits on something hard, it is like his circulation is cut off, ankles and down has some swelling  Date of Service: 12/02/2016 Date of Birth: 1941/07/20 PCP: Caleen Essex, III, MD  History of Present Illness: Mr. Wamser is a 76 y.o.male patient who presents in follow-up. Patient has been noted to have recurrent atrial fibrillation. His rate is currently really well controlled and he is anticoagulated with apixaban. He has been on apixaban for at least 4 weeks. He is symptomatic from his atrial fibrillation. Discussed consideration for cardioversion. Past Medical and Surgical History  Past Medical History Past Medical History:  Diagnosis Date  . Atrial fibrillation , unspecified(CMS-HCC)  . Hyperlipidemia  . Hyperplastic colon polyp 02/07/2015  . OA (osteoarthritis)  . Seasonal allergies  . 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   Medications and Allergies  Current Medications  Current Outpatient Prescriptions  Medication Sig Dispense Refill  . apixaban (ELIQUIS) 5 mg tablet Take 1 tablet (5 mg total) by mouth every 12 (twelve) hours. 60 tablet 0  . ibuprofen (ADVIL,MOTRIN) 200 MG tablet Take 200 mg by mouth every 6 (six) hours as needed for Pain.  . triamcinolone (NASACORT AQ) 55 mcg nasal spray Place 1 spray into both nostrils once daily.  . clotrimazole (LOTRIMIN) 1 % cream Apply topically 2 (two) times daily. (Patient not taking: Reported on 12/02/2016 ) 45 g 1  . flecainide (TAMBOCOR) 50 MG tablet Take 1 tablet (50 mg total) by mouth 2 (two)  times daily. 60 tablet 11  . loratadine (CLARITIN) 10 mg tablet Take 10 mg by mouth once daily.  . metoprolol tartrate (LOPRESSOR) 25 MG tablet Take 1 tablet (25 mg total) by mouth 2 (two) times daily. 60 tablet 11   No current facility-administered medications for this visit.   Allergies: Celebrex [celecoxib]; Prednisone; and Statins-hmg-coa reductase inhibitors  Social and Family History  Social History reports that he quit smoking about 33 years ago. He smoked 1.00 pack per day. He has quit using smokeless tobacco. He reports that he does not drink alcohol or use drugs.  Family History Family History  Problem Relation Age of Onset  . Alzheimer's disease Mother  . Brain cancer Father  . Alcohol abuse Father  . Hepatitis Brother   Review of Systems  Review of Systems  Constitutional: Negative for chills, diaphoresis, fever, malaise/fatigue and weight loss.  HENT: Negative for congestion, ear discharge, hearing loss and tinnitus.  Eyes: Negative for blurred vision.  Respiratory: Positive for cough, sputum production and shortness of breath. Negative for hemoptysis and wheezing.  Cardiovascular: Negative for chest pain, palpitations, orthopnea, claudication, leg swelling and PND.  Gastrointestinal: Negative for abdominal pain, blood in stool, constipation, diarrhea, heartburn, melena, nausea and vomiting.  Genitourinary: Negative for dysuria, frequency, hematuria and urgency.  Musculoskeletal: Negative for back pain, falls, joint pain and myalgias.  Skin: Negative for itching and rash.  Neurological: Negative for dizziness, tingling, focal weakness, loss of consciousness, weakness and headaches.  Endo/Heme/Allergies: Negative for polydipsia. Does not bruise/bleed easily.  Psychiatric/Behavioral: Negative for depression, memory loss and substance abuse. The  patient is not nervous/anxious.   Physical Examination   Vitals:BP 120/82 (BP Location: Left upper arm, Patient Position:  Sitting, BP Cuff Size: Large Adult)  Pulse 78  Ht 180.3 cm (5\' 11" )  Wt (!) 137.8 kg (303 lb 12.8 oz)  BMI 42.37 kg/m  Ht:180.3 cm (5\' 11" ) Wt:(!) 137.8 kg (303 lb 12.8 oz) MCN:OBSJ surface area is 2.63 meters squared. Body mass index is 42.37 kg/m.  Wt Readings from Last 3 Encounters:  12/02/16 (!) 137.8 kg (303 lb 12.8 oz)  09/17/16 (!) 139.5 kg (307 lb 9.6 oz)  08/26/16 (!) 140.6 kg (310 lb)   BP Readings from Last 3 Encounters:  12/02/16 120/82  09/17/16 124/70  08/26/16 150/82   General appearance appears in no acute distress  Head Mouth and Eye exam Normocephalic, without obvious abnormality, atraumatic Dentition is good Eyes appear anicteric   Neck exam Thyroid: normal  Nodes: no obvious adenopathy  LUNGS Breath Sounds: Normal Percussion: Normal  CARDIOVASCULAR JVP CV wave: no HJR: no Elevation at 90 degrees: None Carotid Pulse: normal pulsation bilaterally Bruit: None Apex: apical impulse normal  Auscultation Rhythm: atrial fibrillation S1: normal S2: normal Clicks: no Rub: no Murmurs: no murmurs  Gallop: None ABDOMEN Liver enlargement: no Pulsatile aorta: no Ascites: no Bruits: no  EXTREMITIES Clubbing: no Edema: trace to 1+ bilateral pedal edema Pulses: peripheral pulses symmetrical Femoral Bruits: no Amputation: no SKIN Rash: no Cyanosis: no Embolic phemonenon: no Bruising: no NEURO Alert and Oriented to person, place and time: yes Non focal: yes  PSYCH: Pt appears to have normal affect  LABS REVIEWED Last 3 CBC results: Lab Results  Component Value Date  WBC 7.4 08/26/2016  WBC 7.7 07/15/2014   Lab Results  Component Value Date  HGB 15.2 08/26/2016  HGB 13.9 (L) 07/15/2014   Lab Results  Component Value Date  HCT 44.5 08/26/2016  HCT 39.6 (L) 07/15/2014   Lab Results  Component Value Date  PLT 186 08/26/2016  PLT 183 07/15/2014   Lab Results  Component Value Date  CREATININE 1.0 12/02/2016  BUN 16  12/02/2016  NA 139 12/02/2016  K 4.4 12/02/2016  CL 105 12/02/2016  CO2 29.8 12/02/2016   Lab Results  Component Value Date  ALT 21 07/15/2014  AST 20 07/15/2014  ALKPHOS 62 07/15/2014   Lab Results  Component Value Date  TSH 3.265 07/15/2014   Assessment and Plan   76 y.o. male with  ICD-10-CM ICD-9-CM   1. Atrial fibrillation, unspecified type (CMS-HCC)-recurrent atrial fibrillation. Is currently been anticoagulated with apixaban for greater than 4 weeks. Will place on low-dose metoprolol and start flecainide 50 mg twice daily given the fact he has no evidence of ischemia or structural heart disease. Will proceed with cardioversion after 1 week of flecainide and metoprolol. I48.91 427.31   2. New onset a-fib (CMS-HCC)-as per above I48.91 427.31   3. Pure hypercholesterolemia and low-fat low-cholesterol diet with an LDL goal of 130 or less E78.00 272.0   Return in about 2 weeks (around 12/16/2016).  These notes generated with voice recognition software. I apologize for typographical errors.  Sydnee Levans, MD    Pt seen and examined. No changes

## 2016-12-14 NOTE — Discharge Instructions (Signed)
Electrical Cardioversion, Care After °This sheet gives you information about how to care for yourself after your procedure. Your health care provider may also give you more specific instructions. If you have problems or questions, contact your health care provider. °What can I expect after the procedure? °After the procedure, it is common to have: °· Some redness on the skin where the shocks were given. °Follow these instructions at home: °· Do not drive for 24 hours if you were given a medicine to help you relax (sedative). °· Take over-the-counter and prescription medicines only as told by your health care provider. °· Ask your health care provider how to check your pulse. Check it often. °· Rest for 48 hours after the procedure or as told by your health care provider. °· Avoid or limit your caffeine use as told by your health care provider. °Contact a health care provider if: °· You feel like your heart is beating too quickly or your pulse is not regular. °· You have a serious muscle cramp that does not go away. °Get help right away if: °· You have discomfort in your chest. °· You are dizzy or you feel faint. °· You have trouble breathing or you are short of breath. °· Your speech is slurred. °· You have trouble moving an arm or leg on one side of your body. °· Your fingers or toes turn cold or blue. °This information is not intended to replace advice given to you by your health care provider. Make sure you discuss any questions you have with your health care provider. °Document Released: 06/20/2013 Document Revised: 04/02/2016 Document Reviewed: 03/05/2016 °Elsevier Interactive Patient Education © 2017 [employer] Inc. ° °

## 2016-12-14 NOTE — Anesthesia Postprocedure Evaluation (Signed)
Anesthesia Post Note  Patient: Neil Garcia  Procedure(s) Performed: Procedure(s) (LRB): CARDIOVERSION (N/A)  Anesthesia Type: General     Last Vitals:  Vitals:   12/14/16 0751 12/14/16 0812  BP: 126/66 120/70  Pulse: (!) 56 (!) 58  Resp: 13 16  Temp:      Last Pain:  Vitals:   12/14/16 0640  TempSrc: Oral                 Kamar Callender S

## 2016-12-14 NOTE — Anesthesia Preprocedure Evaluation (Signed)
Anesthesia Evaluation  Patient identified by MRN, date of birth, ID band Patient awake    Reviewed: Allergy & Precautions, NPO status , Patient's Chart, lab work & pertinent test results, reviewed documented beta blocker date and time   Airway Mallampati: III  TM Distance: >3 FB     Dental  (+) Chipped   Pulmonary pneumonia, resolved, former smoker,           Cardiovascular + dysrhythmias Atrial Fibrillation      Neuro/Psych    GI/Hepatic   Endo/Other  Morbid obesity  Renal/GU      Musculoskeletal   Abdominal   Peds  Hematology   Anesthesia Other Findings   Reproductive/Obstetrics                             Anesthesia Physical Anesthesia Plan  ASA: III  Anesthesia Plan: General   Post-op Pain Management:    Induction: Intravenous  Airway Management Planned:   Additional Equipment:   Intra-op Plan:   Post-operative Plan:   Informed Consent: I have reviewed the patients History and Physical, chart, labs and discussed the procedure including the risks, benefits and alternatives for the proposed anesthesia with the patient or authorized representative who has indicated his/her understanding and acceptance.     Plan Discussed with: CRNA  Anesthesia Plan Comments:         Anesthesia Quick Evaluation

## 2016-12-14 NOTE — Anesthesia Procedure Notes (Signed)
Performed by: Ashleymarie Granderson Pre-anesthesia Checklist: Patient identified, Emergency Drugs available, Suction available, Patient being monitored and Timeout performed Patient Re-evaluated:Patient Re-evaluated prior to inductionOxygen Delivery Method: Nasal cannula Preoxygenation: Pre-oxygenation with 100% oxygen Intubation Type: IV induction       

## 2016-12-14 NOTE — Procedures (Signed)
Electrical Cardioversion Procedure Note ANIAS BARTOL [id number] 06/22/1941  Procedure: Electrical Cardioversion Indications:  Atrial Fibrillation  Procedure Details Consent: Risks of procedure as well as the alternatives and risks of each were explained to the (patient/caregiver).  Consent for procedure obtained. Time Out: Verified patient identification, verified procedure, site/side was marked, verified correct patient position, special equipment/implants available, medications/allergies/relevent history reviewed, required imaging and test results available.  Performed  Patient placed on cardiac monitor, pulse oximetry, supplemental oxygen as necessary.  Sedation given: propofol Pacer pads placed anterior and posterior chest.  Cardioverted 1 time(s).  Cardioverted at 150J.  Evaluation Findings: Post procedure EKG shows: sinus bradycardia Complications: None Patient did tolerate procedure well.   Teodoro Spray 12/14/2016, 7:43 AM

## 2016-12-15 ENCOUNTER — Encounter: Payer: Self-pay | Admitting: Cardiology

## 2017-10-05 ENCOUNTER — Other Ambulatory Visit: Payer: Self-pay | Admitting: Family Medicine

## 2017-10-05 DIAGNOSIS — G44209 Tension-type headache, unspecified, not intractable: Secondary | ICD-10-CM

## 2017-10-05 DIAGNOSIS — R2689 Other abnormalities of gait and mobility: Secondary | ICD-10-CM

## 2017-10-05 DIAGNOSIS — R410 Disorientation, unspecified: Secondary | ICD-10-CM

## 2017-10-07 ENCOUNTER — Ambulatory Visit
Admission: RE | Admit: 2017-10-07 | Discharge: 2017-10-07 | Disposition: A | Discharge status: Home / Self Care | Payer: Medicare HMO | Source: Ambulatory Visit | Attending: Family Medicine | Admitting: Family Medicine

## 2017-10-07 DIAGNOSIS — R410 Disorientation, unspecified: Secondary | ICD-10-CM | POA: Diagnosis present

## 2017-10-07 DIAGNOSIS — I6523 Occlusion and stenosis of bilateral carotid arteries: Secondary | ICD-10-CM | POA: Diagnosis not present

## 2017-10-07 DIAGNOSIS — G44209 Tension-type headache, unspecified, not intractable: Secondary | ICD-10-CM | POA: Insufficient documentation

## 2017-10-13 ENCOUNTER — Other Ambulatory Visit: Payer: Self-pay

## 2017-10-13 ENCOUNTER — Emergency Department
Admission: EM | Admit: 2017-10-13 | Discharge: 2017-10-13 | Disposition: A | Discharge status: Other Acute Inpatient Hospital | Payer: Medicare HMO | Attending: Emergency Medicine | Admitting: Emergency Medicine

## 2017-10-13 ENCOUNTER — Encounter: Payer: Self-pay | Admitting: Emergency Medicine

## 2017-10-13 ENCOUNTER — Emergency Department: Payer: Medicare HMO

## 2017-10-13 DIAGNOSIS — G939 Disorder of brain, unspecified: Secondary | ICD-10-CM | POA: Diagnosis not present

## 2017-10-13 DIAGNOSIS — Z79899 Other long term (current) drug therapy: Secondary | ICD-10-CM | POA: Insufficient documentation

## 2017-10-13 DIAGNOSIS — R51 Headache: Secondary | ICD-10-CM | POA: Insufficient documentation

## 2017-10-13 DIAGNOSIS — G9389 Other specified disorders of brain: Secondary | ICD-10-CM

## 2017-10-13 DIAGNOSIS — Z8679 Personal history of other diseases of the circulatory system: Secondary | ICD-10-CM | POA: Insufficient documentation

## 2017-10-13 DIAGNOSIS — R531 Weakness: Secondary | ICD-10-CM

## 2017-10-13 DIAGNOSIS — H5713 Ocular pain, bilateral: Secondary | ICD-10-CM | POA: Diagnosis not present

## 2017-10-13 DIAGNOSIS — H538 Other visual disturbances: Secondary | ICD-10-CM | POA: Diagnosis not present

## 2017-10-13 DIAGNOSIS — R5383 Other fatigue: Secondary | ICD-10-CM | POA: Diagnosis not present

## 2017-10-13 DIAGNOSIS — Z87891 Personal history of nicotine dependence: Secondary | ICD-10-CM | POA: Insufficient documentation

## 2017-10-13 DIAGNOSIS — Z7901 Long term (current) use of anticoagulants: Secondary | ICD-10-CM | POA: Insufficient documentation

## 2017-10-13 LAB — COMPREHENSIVE METABOLIC PANEL
ALBUMIN: 4.5 g/dL (ref 3.5–5.0)
ALT: 42 U/L (ref 17–63)
AST: 33 U/L (ref 15–41)
Alkaline Phosphatase: 64 U/L (ref 38–126)
Anion gap: 9 (ref 5–15)
BILIRUBIN TOTAL: 0.8 mg/dL (ref 0.3–1.2)
BUN: 17 mg/dL (ref 6–20)
CHLORIDE: 105 mmol/L (ref 101–111)
CO2: 24 mmol/L (ref 22–32)
CREATININE: 1.1 mg/dL (ref 0.61–1.24)
Calcium: 9 mg/dL (ref 8.9–10.3)
GFR calc Af Amer: >60 mL/min (ref >60)
GFR calc non Af Amer: >60 mL/min (ref >60)
GLUCOSE: 80 mg/dL (ref 65–99)
Potassium: 4.3 mmol/L (ref 3.5–5.1)
Sodium: 138 mmol/L (ref 135–145)
TOTAL PROTEIN: 7.5 g/dL (ref 6.5–8.1)

## 2017-10-13 LAB — CBC
HEMATOCRIT: 47.9 % (ref 40.0–52.0)
Hemoglobin: 16.1 g/dL (ref 13.0–18.0)
MCH: 28.3 pg (ref 26.0–34.0)
MCHC: 33.6 g/dL (ref 32.0–36.0)
MCV: 84.4 fL (ref 80.0–100.0)
Platelets: 199 10*3/uL (ref 150–440)
RBC: 5.67 MIL/uL (ref 4.40–5.90)
RDW: 13.5 % (ref 11.5–14.5)
WBC: 7.8 10*3/uL (ref 3.8–10.6)

## 2017-10-13 LAB — GLUCOSE, CAPILLARY: GLUCOSE-CAPILLARY: 92 mg/dL (ref 65–99)

## 2017-10-13 LAB — PROTIME-INR
INR: 1.07
Prothrombin Time: 13.8 seconds (ref 11.4–15.2)

## 2017-10-13 LAB — TROPONIN I: Troponin I: <0.03 ng/mL (ref <0.03)

## 2017-10-13 MED ORDER — MORPHINE SULFATE (PF) 4 MG/ML IV SOLN
4.0000 mg | Freq: Once | INTRAVENOUS | Status: AC
  Administered 2017-10-13: 4 mg via INTRAVENOUS

## 2017-10-13 MED ORDER — SODIUM CHLORIDE 0.9 % IV SOLN
1000.0000 mg | Freq: Once | INTRAVENOUS | Status: AC
  Administered 2017-10-13: 1000 mg via INTRAVENOUS
  Filled 2017-10-13: qty 10

## 2017-10-13 MED ORDER — MORPHINE SULFATE (PF) 4 MG/ML IV SOLN
INTRAVENOUS | Status: AC
  Administered 2017-10-13: 4 mg via INTRAVENOUS
  Filled 2017-10-13: qty 1

## 2017-10-13 MED ORDER — GADOBENATE DIMEGLUMINE 529 MG/ML IV SOLN
20.0000 mL | Freq: Once | INTRAVENOUS | Status: AC | PRN
  Administered 2017-10-13: 20 mL via INTRAVENOUS

## 2017-10-13 MED ORDER — DEXAMETHASONE SODIUM PHOSPHATE 10 MG/ML IJ SOLN
10.0000 mg | Freq: Once | INTRAMUSCULAR | Status: AC
  Administered 2017-10-13: 10 mg via INTRAVENOUS
  Filled 2017-10-13: qty 1

## 2017-10-13 MED ORDER — ONDANSETRON HCL 4 MG/2ML IJ SOLN
INTRAMUSCULAR | Status: AC
  Administered 2017-10-13: 4 mg via INTRAVENOUS
  Filled 2017-10-13: qty 2

## 2017-10-13 MED ORDER — ONDANSETRON HCL 4 MG/2ML IJ SOLN
4.0000 mg | Freq: Once | INTRAMUSCULAR | Status: AC
  Administered 2017-10-13: 4 mg via INTRAVENOUS

## 2017-10-13 NOTE — ED Notes (Signed)
Patient transported to CT 

## 2017-10-13 NOTE — ED Provider Notes (Signed)
[facility] Emergency Department Provider Note  Time seen: 12:36 PM  I have reviewed the triage vital signs and the nursing notes.   HISTORY  Chief Complaint Cerebrovascular Accident    HPI Pranay Hilbun Diaz is a 77 y.o. male with a past medical history of atrial fibrillation on Eliquis, presents to the emergency department with generalized weakness, he has been having headache for the past 2 weeks as well as visual disturbance for the past several days, sent from the eye doctor to the emergency department for evaluation.  According to the patient for the past 2 weeks since 10/01/17 he has had a fairly moderate to severe headache.  He states generalized fatigue and weakness over the past 1 week but denies any unilateral symptoms.  Son states however he has been slumping to his left side somewhat.  Patient states for the past 3 or 4 days he has had visual disturbance which she describes as shadows in his vision especially in the periphery.  States it happens in both eyes.  Went to his eye doctor today for eye pain and visual disturbance and was sent to the emergency department for evaluation of possible stroke.  Patient denies any recent fever, cough, congestion, chest pain, abdominal pain.  Has history of atrial fibrillation but currently takes Eliquis and denies missing any doses.  He is currently describes his headache as moderate, eye pain is moderate in bilateral eyes.  Denies any focal weakness or numbness but states a feeling of generalized fatigue and malaise.   Past Medical History:  Diagnosis Date  . Atrial fibrillation ([place])   . Dysrhythmia   . Hypoglycemia   . Seasonal allergies     Patient Active Problem List   Diagnosis Date Noted  . Atrial fibrillation ([place]) 06/01/2016  . Pneumonia 04/06/2016    Past Surgical History:  Procedure Laterality Date  . CARDIOVERSION N/A 12/14/2016   Procedure: CARDIOVERSION;  Surgeon: Teodoro Spray, MD;  Location: [facility] ORS;   Service: Cardiovascular;  Laterality: N/A;  . collarbone Left   . COLONOSCOPY N/A 02/07/2015   Procedure: COLONOSCOPY;  Surgeon: Josefine Class, MD;  Location: [facility] ENDOSCOPY;  Service: Endoscopy;  Laterality: N/A;  . ELECTROPHYSIOLOGIC STUDY N/A 05/28/2016   Procedure: CARDIOVERSION;  Surgeon: Teodoro Spray, MD;  Location: [facility] ORS;  Service: Cardiovascular;  Laterality: N/A;  . ELECTROPHYSIOLOGIC STUDY N/A 06/02/2016   Procedure: CARDIOVERSION;  Surgeon: Teodoro Spray, MD;  Location: [facility] ORS;  Service: Cardiovascular;  Laterality: N/A;  . HERNIA REPAIR    . JOINT REPLACEMENT     LT Hip 2013    Prior to Admission medications   Medication Sig Start Date End Date Taking? Authorizing Provider  apixaban (ELIQUIS) 5 MG TABS tablet Take 5 mg by mouth 2 (two) times daily.    [provider]  flecainide (TAMBOCOR) 50 MG tablet Take 50 mg by mouth 2 (two) times daily.    [provider]  guaiFENesin-dextromethorphan (ROBITUSSIN DM) 100-10 MG/5ML syrup Take 5 mLs by mouth every 4 (four) hours as needed for cough. Patient not taking: Reported on 12/08/2016 04/08/16   Fritzi Mandes, MD  metoprolol tartrate (LOPRESSOR) 25 MG tablet Take 25 mg by mouth 2 (two) times daily.    [provider]  terazosin (HYTRIN) 5 MG capsule Take 5 mg by mouth at bedtime.     [provider]  triamcinolone (NASACORT AQ) 55 MCG/ACT AERO nasal inhaler Place 1 spray into the nose daily.  [provider]    Allergies  Allergen Reactions  . Prednisone Other (See Comments)    Causes patient to become very nervous; insomnia  . Statins Other (See Comments)    Cramping and weak   . Celecoxib Rash and Other (See Comments)    Nervousness, hyper     Family History  Problem Relation Age of Onset  . Alzheimer's disease Mother   . Brain cancer Father     Social History Social History   Tobacco Use  . Smoking status: Former Research scientist (life sciences)  . Smokeless tobacco: Former Chief Strategy Officer Use Topics  . Alcohol use: No  . Drug use: No    Review of Systems Constitutional: Negative for fever. Eyes: Eye pain with decreased vision in both eyes. ENT: Negative for recent illness Cardiovascular: Negative for chest pain. Respiratory: Negative for shortness of breath. Gastrointestinal: Negative for abdominal pain, vomiting and diarrhea. Genitourinary: Negative for urinary compaints Musculoskeletal: States generalized/diffuse weakness in all extremities Skin: Negative for skin complaints  Neurological: Significant headache over the past 2 weeks, generalized weakness in all extremities. All other ROS negative  ____________________________________________   PHYSICAL EXAM:  VITAL SIGNS: ED Triage Vitals  Enc Vitals Group     BP 10/13/17 1134 (!) 117/96     Pulse Rate 10/13/17 1134 98     Resp 10/13/17 1134 18     Temp 10/13/17 1134 98.6 F (37 C)     Temp Source 10/13/17 1134 Oral     SpO2 10/13/17 1134 100 %     Weight 10/13/17 1136 (!) 304 lb (137.9 kg)     Height 10/13/17 1136 5\' 7"  (1.702 m)     Head Circumference --      Peak Flow --      Pain Score 10/13/17 1135 6     Pain Loc --      Pain Edu? --      Excl. in [place]? --    Constitutional: Alert and oriented. Well appearing and in no distress. Eyes: Dilated pupils (status post eye exam), moderate photophobia with light. ENT   Head: Normocephalic and atraumatic.   Mouth/Throat: Mucous membranes are moist. Cardiovascular: Irregular rhythm, rate around 90 bpm. Respiratory: Normal respiratory effort without tachypnea nor retractions. Breath sounds are clear  Gastrointestinal: Soft and nontender. No distention. Musculoskeletal: Nontender with normal range of motion in all extremities. Neurologic: Normal speech and language.  Patient has 4/5 strength in lower extremities 4/5 strength in upper extremities.  No pronator drift.  No lower extremity drift.  Appears to have a minimal left facial droop on  my exam however largely resolved with effort such as attempting to smile.  Skin:  Skin is warm, dry and intact.  Psychiatric: Mood and affect are normal.  ____________________________________________    EKG  EKG reviewed and interpreted by myself shows atrial fibrillation at 87 bpm with a narrow QRS, normal axis, normal intervals, nonspecific but no concerning ST changes. ____________________________________________    WFUXNATFT  CT scan most consistent with significant right-sided vasogenic edema.  MRI recommended.  ____________________________________________   INITIAL IMPRESSION / ASSESSMENT AND PLAN / ED COURSE  Pertinent labs & imaging results that were available during my care of the patient were reviewed by me and considered in my medical decision making (see chart for details).  Patient presents the emergency department with headache, generalized weakness, eye pain and visual disturbance.  Differential would include CVA, ICH, metabolic or electrolyte abnormality, infectious etiology.  Overall the patient  appears well, speaking normally, conversing well.  Family states symptoms have been ongoing for at least 1 week.  Patient believes symptoms have only been present for 3 or 4 days besides a headache which has been present for nearly 2 weeks per patient.  I have ordered a CT scan of the head, labs, EKG we will continue to closely monitor in the emergency department.  CT scan of the head unfortunately appears to show a large area of vasogenic edema in the right hemisphere.  Discussed with the radiologist, we will proceed with an MRI of the brain with contrast to further evaluate.  I discussed this with the patient and family they are agreeable to this plan of care.  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  I discussed the patient with [place] neurosurgery, Dr.Zomorodi, who is accepted the patient to [facility] for  further evaluation.  Patient agreeable to this plan of care.  Awaiting bed at this time.  ____________________________________________   FINAL CLINICAL IMPRESSION(S) / ED DIAGNOSES  Generalized weakness Brain tumor    Harvest Dark, MD 10/13/17 1502

## 2017-10-13 NOTE — ED Notes (Signed)
Pt presents from eye doctor with stated dx of "stroke" from eye doctor - the pt reports that symptoms started over a month ago but got worse 2 weeks ago - charge nurse is aware and pt will be taken to RM#3

## 2017-10-13 NOTE — ED Notes (Signed)
Patient transported to MRI 

## 2017-10-13 NOTE — ED Notes (Signed)
Called [facility] transport to transport patient to [place]

## 2017-10-13 NOTE — ED Triage Notes (Signed)
Pt reports that he has had headaches, dizziness, and blurred vision for several weeks. He was at the eye doctor and they told him to come over here that he was having a stroke in the visual part of his brain.

## 2017-10-13 NOTE — ED Notes (Signed)
Transfer consent signed by patients wife, Neil Garcia.

## 2017-10-13 NOTE — ED Notes (Signed)
Received call from [place] at [facility] he stated [facility] could not transport patient due to distance and amount of time for turn around. [place]

## 2017-10-13 NOTE — ED Notes (Signed)
Called EMS for transport 1520

## 2017-10-13 NOTE — Progress Notes (Signed)
[place] was rounding in ED and charge recommended Ch visit with Pt due to just receiving bad news. Ch. Practiced active listen as Pt discussed forgiveness and desire for salvation. Family requested healing. Ch prayed with Pt and family. Pt offered a pr gratitude.    10/13/17 1500  Clinical Encounter Type  Visited With Patient and family together  Visit Type Initial;Spiritual support  Referral From Nurse  Spiritual Encounters  Spiritual Needs Prayer;Emotional  Stress Factors  Patient Stress Factors Major life changes  Family Stress Factors Major life changes

## 2017-10-13 NOTE — ED Notes (Signed)
Called to [phone] to give transport update to Neuro charge RN.

## 2017-10-13 NOTE — ED Notes (Signed)
EMTALA checked for completion  

## 2017-10-13 NOTE — ED Notes (Signed)
Called CT/MRI for images to be powershared to [facility]  1515

## 2017-10-13 NOTE — ED Notes (Signed)
EDP to bedside to provide pt and family with update. 

## 2017-10-13 NOTE — Progress Notes (Signed)
[place] following up for previous [place]. [facility] provided prayer shawl for patient. Patient's wife accepted the prayer shawl. [place] provided emotional support, prayer shawl, and silent prayer. Previous [place] stated patient was being transferred to [facility].

## 2017-10-13 NOTE — ED Notes (Signed)
Pt provided turkey sandwich tray upon request. 

## 2017-10-13 NOTE — ED Notes (Signed)
[facility] here to transfer pt to [facility].

## 2017-10-13 NOTE — ED Notes (Signed)
[facility] transport Mitzi Hansen called they will be at [facility] approximately 2000 for transport to [facility]

## 2017-10-14 ENCOUNTER — Ambulatory Visit: Admission: RE | Admit: 2017-10-14 | Payer: Medicare HMO | Source: Ambulatory Visit

## 2017-10-31 ENCOUNTER — Ambulatory Visit: Payer: Medicare HMO

## 2017-11-02 ENCOUNTER — Ambulatory Visit: Payer: Medicare HMO | Attending: Adult Health Nurse Practitioner | Admitting: Occupational Therapy

## 2017-11-02 ENCOUNTER — Encounter: Payer: Medicare HMO | Admitting: Occupational Therapy

## 2017-11-02 DIAGNOSIS — R278 Other lack of coordination: Secondary | ICD-10-CM | POA: Insufficient documentation

## 2017-11-02 DIAGNOSIS — H547 Unspecified visual loss: Secondary | ICD-10-CM

## 2017-11-02 DIAGNOSIS — H543 Unqualified visual loss, both eyes: Secondary | ICD-10-CM | POA: Diagnosis present

## 2017-11-02 NOTE — Therapy (Signed)
[place] MAIN [facility] SERVICES [address] [place], [place], [address] Phone: [phone]   Fax:  [phone]  Occupational Therapy Treatment  Patient Details  Name: Neil Garcia MRN: [id number] Date of Birth: [birth year] Referring Provider: Dr Ouida Sills   Encounter Date: 11/02/2017    Past Medical History:  Diagnosis Date  . Atrial fibrillation ([place])   . Dysrhythmia   . Hypoglycemia   . Seasonal allergies     Past Surgical History:  Procedure Laterality Date  . CARDIOVERSION N/A 12/14/2016   Procedure: CARDIOVERSION;  Surgeon: Teodoro Spray, MD;  Location: [facility] ORS;  Service: Cardiovascular;  Laterality: N/A;  . collarbone Left   . COLONOSCOPY N/A 02/07/2015   Procedure: COLONOSCOPY;  Surgeon: Josefine Class, MD;  Location: [facility] ENDOSCOPY;  Service: Endoscopy;  Laterality: N/A;  . ELECTROPHYSIOLOGIC STUDY N/A 05/28/2016   Procedure: CARDIOVERSION;  Surgeon: Teodoro Spray, MD;  Location: [facility] ORS;  Service: Cardiovascular;  Laterality: N/A;  . ELECTROPHYSIOLOGIC STUDY N/A 06/02/2016   Procedure: CARDIOVERSION;  Surgeon: Teodoro Spray, MD;  Location: [facility] ORS;  Service: Cardiovascular;  Laterality: N/A;  . HERNIA REPAIR    . JOINT REPLACEMENT     LT Hip 2013    There were no vitals filed for this visit.  Subjective Assessment - 11/02/17 1149    Subjective   Pt. reports he would like to get back to fishing as soon as he can.    Patient is accompained by:  Family member    Pertinent History  Pt. is a 77 y.o. male who was diagnosed with a Benign Brain Tumor which was identified after pt. was having difficulty seeing images in the left. Pt. was hospitalized on 10/13/2017 to remove the Brain Tumor in [facility] through [facility].  Pt. received rehab services while in the hospital. Pt. is now ready for outpatient therapy services.    Currently in Pain?  No/denies         [facility] OT Assessment - 11/02/17 1003      Assessment   Medical Diagnosis  Brain Tumor    Referring Provider  Dr Ouida Sills    Onset Date/Surgical Date  10/13/17    Hand Dominance  Right    Next MD Visit  11/16/2017      Precautions   Precautions  -- No bending, lifting over 5#      Balance Screen   Has the patient had a decrease in activity level because of a fear of falling?   No    Is the patient reluctant to leave their home because of a fear of falling?   No      Home  Environment   Family/patient expects to be discharged to:  Private residence    Type of [place]  One level    Alternate Level Stairs - Number of Steps  2    [place] bars - toilet;Shower seat - built in;Tub bench;Walker - Psychologist, educational - 4 wheels    Lives With  Spouse      Prior Function   Level of Independence  Independent    Vocation  Retired    Glass blower/designer, Scientist, forensic, Development worker, community for [place]      ADL   Eating/Feeding  Independent    Grooming  Independent  Upper Body Bathing  Independent    Lower Body Bathing  Independent spongebaths until staples are removed.    Upper Body Dressing  Independent    Lower Body Dressing  Independent    Toilet Transfer  Independent    Toileting - [place]  Independent    Tub/Shower Transfer  -- Shower restrictions until staples       IADL   Shopping  Needs to be accompanied on any shopping trip    Meal Prep  Plans, prepares and serves adequate meals independently    Medication Management  Is responsible for taking medication in correct dosages at correct time    Financial Management  -- Wife handles finances      Written Expression   Dominant Hand  Right    Handwriting  100% legible      Vision - History   Patient Visual Report  Peripheral vision impairment Left sided visual deficits, sparkiling, and vision loss.       Activity Tolerance   Activity Tolerance  Tolerates 10-20 min activity with multiple rests      Cognition   Overall Cognitive Status  Impaired/Different from baseline    Memory  Impaired      Sensation   Light Touch  Appears Intact      Coordination   Right 9 Hole Peg Test  32    Left 9 Hole Peg Test  35      Strength   Overall Strength Comments  BUE strength5/5 overall      Hand Function   Right Hand Grip (lbs)  93    Right Hand Lateral Pinch  25 lbs    Right Hand 3 Point Pinch  23 lbs    Left Hand Grip (lbs)  85    Left Hand Lateral Pinch  24 lbs    Left 3 point pinch  20 lbs                            OT Long Term Goals - 11/02/17 1213      OT LONG TERM GOAL #1   Title  Pt. will independently demonstrate visual compensatory strategies for during tabletop ADL tasks within his near space.     Baseline  Pt has difficulty    Time  12    Period  Weeks    Status  New    Target Date  01/25/18      OT LONG TERM GOAL #2   Title  Pt. will independently demonstrate visual compensatory strategies when navigating his envronment, and new palces during ADLs, and IADLs.     Baseline  Pt. has difficulty    Time  12    Period  Weeks    Status  New    Target Date  01/25/18      OT LONG TERM GOAL #3   Title  Pt. will improve Left hand FMC, speed and dexterity to be able to perfrom fishing tasks efficiently.    Baseline  Pt. has difficulty    Time  12    Period  Weeks    Status  New    Target Date  01/25/18      OT LONG TERM GOAL #4   Title  Pt. will independently demonstrate good safety awareness, and judgement duirng ADLs, and IADLs    Baseline  Pt. has difficulty    Time  12    Period  Weeks    Status  New    Target Date  01/25/18            Plan - 11/02/17 1204    Clinical Impression Statement  Pt. is a 77 y.o. male who was diagnosed with a Brain Tumor. Pt. presents with limited left sided visual impairments, cognitive changes, and limited  FMC, speed, and dexterity. Pt. will benefit from OT services for pt. education about visual compensatory strategies during tabletop ADL, and IADL tasks, as well as navigating within his environmnet independently for increased engagemnet in ADL, and IADL tasks.    Occupational performance deficits (Please refer to evaluation for details):  ADL's;IADL's    Rehab Potential  Good    OT Frequency  2x / week    OT Duration  12 weeks    OT Treatment/Interventions  Self-care/ADL training;DME and/or AE instruction;Patient/family education;Energy conservation;Therapeutic exercise;Therapeutic activities;Neuromuscular education;Visual/perceptual remediation/compensation    Plan  Formally assess vision to determine how it may be affecting ADLs, and IADLs.    Clinical Decision Making  Multiple treatment options, significant modification of task necessary    Recommended Other Services  PT    Consulted and Agree with Plan of Care  Patient       Patient will benefit from skilled therapeutic intervention in order to improve the following deficits and impairments:  Impaired UE functional use, Decreased balance, Decreased activity tolerance, Decreased cognition, Decreased range of motion, Decreased strength, Decreased coordination, Impaired vision/preception  Visit Diagnosis: Other lack of coordination  Visual impairment    Problem List Patient Active Problem List   Diagnosis Date Noted  . Atrial fibrillation ([place]) 06/01/2016  . Pneumonia 04/06/2016    [name], MS, OTR/L 11/02/2017, 12:20 PM  [place] MAIN [facility] SERVICES [address] [place], [place], [address] Phone: [phone]   Fax:  [phone]  Name: Neil Garcia MRN: [id number] Date of Birth: 10-14-1940

## 2017-11-02 NOTE — Addendum Note (Signed)
Addended by: Lucia Bitter on: 11/02/2017 12:24 PM   Modules accepted: Orders

## 2017-11-08 ENCOUNTER — Encounter: Payer: Self-pay | Admitting: Physical Therapy

## 2017-11-08 ENCOUNTER — Ambulatory Visit: Payer: Medicare HMO | Admitting: Physical Therapy

## 2017-11-08 DIAGNOSIS — H547 Unspecified visual loss: Secondary | ICD-10-CM

## 2017-11-08 DIAGNOSIS — R278 Other lack of coordination: Secondary | ICD-10-CM | POA: Diagnosis not present

## 2017-11-08 NOTE — Therapy (Signed)
[place] MAIN [facility] SERVICES [address] [place], [place], [address] Phone: [phone]   Fax:  [phone]  Physical Therapy Evaluation  Patient Details  Name: Neil Garcia MRN: [id number] Date of Birth: August 03, 1941 Referring Provider: Kerney Elbe.   Encounter Date: 11/08/2017  PT End of Session - 11/08/17 4401    Visit Number  1    Number of Visits  1    PT Start Time  0855    PT Stop Time  0920    PT Time Calculation (min)  25 min    Equipment Utilized During Treatment  Gait belt    Activity Tolerance  Patient tolerated treatment well    Behavior During Therapy  WFL for tasks assessed/performed       Past Medical History:  Diagnosis Date  . Atrial fibrillation ([place])   . Dysrhythmia   . Hypoglycemia   . Seasonal allergies     Past Surgical History:  Procedure Laterality Date  . CARDIOVERSION N/A 12/14/2016   Procedure: CARDIOVERSION;  Surgeon: Teodoro Spray, MD;  Location: [facility] ORS;  Service: Cardiovascular;  Laterality: N/A;  . collarbone Left   . COLONOSCOPY N/A 02/07/2015   Procedure: COLONOSCOPY;  Surgeon: Josefine Class, MD;  Location: [facility] ENDOSCOPY;  Service: Endoscopy;  Laterality: N/A;  . ELECTROPHYSIOLOGIC STUDY N/A 05/28/2016   Procedure: CARDIOVERSION;  Surgeon: Teodoro Spray, MD;  Location: [facility] ORS;  Service: Cardiovascular;  Laterality: N/A;  . ELECTROPHYSIOLOGIC STUDY N/A 06/02/2016   Procedure: CARDIOVERSION;  Surgeon: Teodoro Spray, MD;  Location: [facility] ORS;  Service: Cardiovascular;  Laterality: N/A;  . HERNIA REPAIR    . JOINT REPLACEMENT     LT Hip 2013    There were no vitals filed for this visit.   Subjective Assessment - 11/08/17 0903    Subjective  Patient is having vision but doesnt feetl like he is having any strength or walking difficultys.     Pertinent History   Pt. is a 77 y.o. male who was diagnosed with a Benign Brain Tumor which was identified after pt. was having difficulty  seeing images in the left. Pt. was hospitalized on 10/13/2017 to remove the Brain Tumor in [facility] through [facility].  Pt. received rehab services while in the hospital. Pt. is now ready for outpatient therapy services    How long can you stand comfortably?  wnl    How long can you walk comfortably?  wnl    Patient Stated Goals  Patient feels like he is doing well , no goals     Currently in Pain?  No/denies    Multiple Pain Sites  No         [facility] PT Assessment - 11/08/17 0905      Assessment   Medical Diagnosis  Brain Tumor    Referring Provider  DENNIS, CHRISTINA M.    Onset Date/Surgical Date  10/13/17    Hand Dominance  Right    Next MD Visit  11/16/2017    Prior Therapy  in patient hospital      Precautions   Precautions  None      Restrictions   Weight Bearing Restrictions  No      Balance Screen   Has the patient fallen in the past 6 months  No    Has the patient had a decrease in activity level because of a fear of falling?   No    Is the patient reluctant to  leave their home because of a fear of falling?   No      Home Film/video editor residence    Living Arrangements  Spouse/significant other    Available Help at Discharge  Family;Friend(s)    Type of [place] to enter    Entrance Stairs-Number of Steps  2    [place]  One level    Home Equipment  None      Prior Function   Level of Independence  Independent    Vocation  Retired    Glass blower/designer, Scientist, forensic, Development worker, community for [place]   Overall Cognitive Status  Within Functional Limits for tasks assessed        POSTURE: WNL   PROM/AROM: WNL  STRENGTH:  Graded on a 0-5 scale Muscle Group Left Right                          Hip Flex 5/5 5/5  Hip Abd 5/5 5/5  Hip Add 5/5 5/5  Hip Ext 4/5 4/5  Hip IR/ER 5/5 5/5  Knee Flex 5/5 5/5  Knee Ext 5/5 5/5   Ankle DF 5/5 5/5  Ankle PF 5/5 5/5   SENSATION: numbness in B feet     FUNCTIONAL MOBILITY: WNL transfers and bed mobility is WNL   BALANCE: single leg stand L 5 sec, R 10 sec Tandem stand  30 sec no assistance no loss of balance  Standing Dynamic Balance  Normal Stand independently unsupported, able to weight shift and cross midline maximally x  Good Stand independently unsupported, able to weight shift and cross midline moderately   Good-/Fair+ Stand independently unsupported, able to weight shift across midline minimally   Fair Stand independently unsupported, weight shift, and reach ipsilaterally, loss of balance when crossing midline   Poor+ Able to stand with Min A and reach ipsilaterally, unable to weight shift   Poor Able to stand with Mod A and minimally reach ipsilaterally, unable to cross midline.    Static Standing Balance  Normal Able to maintain standing balance against maximal resistance x  Good Able to maintain standing balance against moderate resistance   Good-/Fair+ Able to maintain standing balance against minimal resistance   Fair Able to stand unsupported without UE support and without LOB for 1-2 min   Fair- Requires Min A and UE support to maintain standing without loss of balance   Poor+ Requires mod A and UE support to maintain standing without loss of balance   Poor Requires max A and UE support to maintain standing balance without loss       GAIT: Ambulates without AD , normal gait speed ,n o deficits ,no path deviation,   OUTCOME MEASURES: TEST Outcome Interpretation  5 times sit<>stand 11.25sec >83 yo, >15 sec indicates increased risk for falls  10 meter walk test   1.48              m/s <1.0 m/s indicates increased risk for falls; limited community ambulator  Timed up and Go     8.36            sec <14 sec indicates increased risk for falls  Objective measurements completed on examination: See above findings.               PT Education - 11/08/17 0907    Education provided  Yes    Education Details  Plan of care    Person(s) Educated  Patient    Methods  Explanation    Comprehension  Verbalized understanding                  Plan - 11/08/17 0931    Clinical Impression Statement  Patient is 77 year old male s/p brain tunor surgery. He presents to clinic with no gait deficits. He has WNL strength BLE and WNL static and dynamic standing balance .His outcome measures are WNL for 5 x sit to stand, TUG, 10 MW test. He does not have any skilled  PT needs at this time.     Clinical Presentation  Stable    Clinical Decision Making  Low    PT Frequency  One time visit    Consulted and Agree with Plan of Care  Patient       Patient will benefit from skilled therapeutic intervention in order to improve the following deficits and impairments:     Visit Diagnosis: Other lack of coordination  Visual impairment     Problem List Patient Active Problem List   Diagnosis Date Noted  . Atrial fibrillation ([place]) 06/01/2016  . Pneumonia 04/06/2016    Alanson Puls, PT DPT 11/08/2017, 9:39 AM  [place] MAIN [facility] SERVICES [address] [place], [place], [address] Phone: [phone]   Fax:  [phone]  Name: KESEAN SERVISS MRN: [id number] Date of Birth: 08-16-1941

## 2017-11-10 ENCOUNTER — Encounter: Payer: Self-pay | Admitting: Occupational Therapy

## 2017-11-10 ENCOUNTER — Ambulatory Visit: Payer: Medicare HMO | Admitting: Occupational Therapy

## 2017-11-10 ENCOUNTER — Ambulatory Visit: Payer: Medicare HMO | Admitting: Physical Therapy

## 2017-11-10 DIAGNOSIS — H543 Unqualified visual loss, both eyes: Secondary | ICD-10-CM

## 2017-11-10 DIAGNOSIS — H547 Unspecified visual loss: Secondary | ICD-10-CM

## 2017-11-10 DIAGNOSIS — R278 Other lack of coordination: Secondary | ICD-10-CM | POA: Diagnosis not present

## 2017-11-10 NOTE — Therapy (Signed)
[place] MAIN [facility] SERVICES [address] [place], [place], [address] Phone: [phone]   Fax:  [phone]  Occupational Therapy Treatment  Patient Details  Name: Neil Garcia MRN: [id number] Date of Birth: 11/19/1940 Referring Provider: Kerney Elbe.   Encounter Date: 11/10/2017  OT End of Session - 11/10/17 0936    Visit Number  2    Number of Visits  24    Date for OT Re-Evaluation  01/25/18    OT Start Time  0915    OT Stop Time  1000    OT Time Calculation (min)  45 min    Activity Tolerance  Patient tolerated treatment well    Behavior During Therapy  [facility] for tasks assessed/performed       Past Medical History:  Diagnosis Date  . Atrial fibrillation ([place])   . Dysrhythmia   . Hypoglycemia   . Seasonal allergies     Past Surgical History:  Procedure Laterality Date  . CARDIOVERSION N/A 12/14/2016   Procedure: CARDIOVERSION;  Surgeon: Teodoro Spray, MD;  Location: [facility] ORS;  Service: Cardiovascular;  Laterality: N/A;  . collarbone Left   . COLONOSCOPY N/A 02/07/2015   Procedure: COLONOSCOPY;  Surgeon: Josefine Class, MD;  Location: [facility] ENDOSCOPY;  Service: Endoscopy;  Laterality: N/A;  . ELECTROPHYSIOLOGIC STUDY N/A 05/28/2016   Procedure: CARDIOVERSION;  Surgeon: Teodoro Spray, MD;  Location: [facility] ORS;  Service: Cardiovascular;  Laterality: N/A;  . ELECTROPHYSIOLOGIC STUDY N/A 06/02/2016   Procedure: CARDIOVERSION;  Surgeon: Teodoro Spray, MD;  Location: [facility] ORS;  Service: Cardiovascular;  Laterality: N/A;  . HERNIA REPAIR    . JOINT REPLACEMENT     LT Hip 2013    There were no vitals filed for this visit.  Subjective Assessment - 11/10/17 0919    Subjective   Pt. reports he finished with PT.    Patient is accompained by:  Family member    Currently in Pain?  Yes        OT TREATMENT    Selfcare:  Pt. education was provided about visual compensatory strategies during ADLs, and IADLs. Pt. worked on  visual scanning tasks using reverse horizontal, and vertical rectilinear  search strategies with increased time required to complete. Pt. reported images flying off the left side of the page. Pt. worked on tabletop visual scanning tasks, and visual scanning at a scanboard.                      OT Education - 11/10/17 0935    Education provided  Yes    Education Details  visual compensatory strategies, visual scanning    Person(s) Educated  Patient    Methods  Explanation    Comprehension  Verbalized understanding          OT Long Term Goals - 11/02/17 1213      OT LONG TERM GOAL #1   Title  Pt. will independently demonstrate visual compensatory strategies for during tabletop ADL tasks within his near space.     Baseline  Pt has difficulty    Time  12    Period  Weeks    Status  New    Target Date  01/25/18      OT LONG TERM GOAL #2   Title  Pt. will independently demonstrate visual compensatory strategies when navigating his envronment, and new palces during ADLs, and IADLs.     Baseline  Pt. has difficulty  Time  12    Period  Weeks    Status  New    Target Date  01/25/18      OT LONG TERM GOAL #3   Title  Pt. will improve Left hand FMC, speed and dexterity to be able to perfrom fishing tasks efficiently.    Baseline  Pt. has difficulty    Time  12    Period  Weeks    Status  New    Target Date  01/25/18      OT LONG TERM GOAL #4   Title  Pt. will independently demonstrate good safety awareness, and judgement duirng ADLs, and IADLs    Baseline  Pt. has difficulty    Time  12    Period  Weeks    Status  New    Target Date  01/25/18            Plan - 11/10/17 2409    Clinical Impression Statement Pt. has a follow-up MD appointment with the neurosurgeon next week. Pt. reports visual changes, and worsening of vision over the past 2 days. Pt. reports he is  now having  worsening of vision in the right eye, with changes in the left visual field  of the right eye, as well as an increase in flashes, and sparkles. Pt. reports worsening vision in the left eye over the past 2 days, as well. Pt. was encouraged to notify his physician of this change. Pt. May benefit from a neuro-opthamology consult. Pt. Continues to work on visual compensatory strategies for improved  ADL, and IADL functioning.   Occupational performance deficits (Please refer to evaluation for details):  IADL's    Rehab Potential  Good    OT Frequency  2x / week    OT Duration  12 weeks    OT Treatment/Interventions  Self-care/ADL training;DME and/or AE instruction;Patient/family education;Energy conservation;Therapeutic exercise;Therapeutic activities;Neuromuscular education;Visual/perceptual remediation/compensation    Plan  Formally assess vision to determine how it may be affecting ADLs, and IADLs.    Clinical Decision Making  Multiple treatment options, significant modification of task necessary    Consulted and Agree with Plan of Care  Patient       Patient will benefit from skilled therapeutic intervention in order to improve the following deficits and impairments:  Impaired UE functional use, Decreased balance, Decreased activity tolerance, Decreased cognition, Decreased range of motion, Decreased strength, Decreased coordination, Impaired vision/preception  Visit Diagnosis: Low vision, both eyes  Visual impairment    Problem List Patient Active Problem List   Diagnosis Date Noted  . Atrial fibrillation ([place]) 06/01/2016  . Pneumonia 04/06/2016    [name], MS, OTR/L 11/10/2017, 5:15 PM  [place] MAIN [facility] SERVICES [address] [place], [place], [address] Phone: [phone]   Fax:  [phone]  Name: Neil Garcia MRN: [id number] Date of Birth: 05-01-1941

## 2017-11-14 ENCOUNTER — Encounter: Payer: Self-pay | Admitting: Occupational Therapy

## 2017-11-14 ENCOUNTER — Ambulatory Visit: Payer: Medicare HMO | Attending: Adult Health Nurse Practitioner | Admitting: Occupational Therapy

## 2017-11-14 ENCOUNTER — Ambulatory Visit: Payer: Medicare HMO | Admitting: Physical Therapy

## 2017-11-14 DIAGNOSIS — H543 Unqualified visual loss, both eyes: Secondary | ICD-10-CM | POA: Diagnosis present

## 2017-11-14 DIAGNOSIS — H547 Unspecified visual loss: Secondary | ICD-10-CM | POA: Diagnosis not present

## 2017-11-14 NOTE — Therapy (Signed)
[place] MAIN [facility] SERVICES [address] [place], [place], [address] Phone: [phone]   Fax:  [phone]  Occupational Therapy Treatment  Patient Details  Name: Neil Garcia MRN: [id number] Date of Birth: 08-22-1941 Referring Provider: Kerney Elbe.   Encounter Date: 11/14/2017  OT End of Session - 11/14/17 1311    Visit Number  3    Number of Visits  24    Date for OT Re-Evaluation  01/25/18    OT Start Time  1145    OT Stop Time  1225    OT Time Calculation (min)  40 min    Activity Tolerance  Patient tolerated treatment well    Behavior During Therapy  [facility] for tasks assessed/performed       Past Medical History:  Diagnosis Date  . Atrial fibrillation ([place])   . Dysrhythmia   . Hypoglycemia   . Seasonal allergies     Past Surgical History:  Procedure Laterality Date  . CARDIOVERSION N/A 12/14/2016   Procedure: CARDIOVERSION;  Surgeon: Teodoro Spray, MD;  Location: [facility] ORS;  Service: Cardiovascular;  Laterality: N/A;  . collarbone Left   . COLONOSCOPY N/A 02/07/2015   Procedure: COLONOSCOPY;  Surgeon: Josefine Class, MD;  Location: [facility] ENDOSCOPY;  Service: Endoscopy;  Laterality: N/A;  . ELECTROPHYSIOLOGIC STUDY N/A 05/28/2016   Procedure: CARDIOVERSION;  Surgeon: Teodoro Spray, MD;  Location: [facility] ORS;  Service: Cardiovascular;  Laterality: N/A;  . ELECTROPHYSIOLOGIC STUDY N/A 06/02/2016   Procedure: CARDIOVERSION;  Surgeon: Teodoro Spray, MD;  Location: [facility] ORS;  Service: Cardiovascular;  Laterality: N/A;  . HERNIA REPAIR    . JOINT REPLACEMENT     LT Hip 2013    There were no vitals filed for this visit.  Subjective Assessment - 11/14/17 1309    Subjective   Pt. reports having a follow-up appointment with his Neurosurgeon this Thursday.    Patient is accompained by:  Family member    Pertinent History  Pt. is a 77 y.o. male who was diagnosed with a Benign Brain Tumor which was identified after pt. was  having difficulty seeing images in the left. Pt. was hospitalized on 10/13/2017 to remove the Brain Tumor in [facility] through [place].  Pt. received rehab services while in the hospital. Pt. is now ready for outpatient therapy services.    Currently in Pain?  No/denies       OT TREATMENT    Selfcare:  Pt. education was provided about visual compensatory strategies for ADLs, and IADL. Pt. is able to utilize these strategies with little cuing.  Pt. utilized normal horizontal, and rectilinear visual search strategies during tabletop tasks, and navigating within his environment. Pt. had a few omissions during unstructured random tabletop tasks using the [place].                         OT Education - 11/14/17 1310    Education provided  Yes    Education Details  Visual compensatory strategies for ADLs, and IADLs.    Person(s) Educated  Patient    Methods  Explanation    Comprehension  Verbalized understanding          OT Long Term Goals - 11/02/17 1213      OT LONG TERM GOAL #1   Title  Pt. will independently demonstrate visual compensatory strategies for during tabletop ADL tasks within his near space.     Baseline  Pt has difficulty    Time  12    Period  Weeks    Status  New    Target Date  01/25/18      OT LONG TERM GOAL #2   Title  Pt. will independently demonstrate visual compensatory strategies when navigating his envronment, and new palces during ADLs, and IADLs.     Baseline  Pt. has difficulty    Time  12    Period  Weeks    Status  New    Target Date  01/25/18      OT LONG TERM GOAL #3   Title  Pt. will improve Left hand FMC, speed and dexterity to be able to perfrom fishing tasks efficiently.    Baseline  Pt. has difficulty    Time  12    Period  Weeks    Status  New    Target Date  01/25/18      OT LONG TERM GOAL #4   Title  Pt. will independently demonstrate good safety awareness, and judgement duirng ADLs, and IADLs    Baseline   Pt. has difficulty    Time  12    Period  Weeks    Status  New    Target Date  01/25/18            Plan - 11/14/17 1740    Clinical Impression Statement  Pt. has had a change with his vision last week. Pt. reports following up with his eye physician, Dr. Stephenie Acres. Pt. now plans to follow-up with his neurosurgeon on Thursday. Pt. will wait until after the neurosurgeon appointment to follow-up with OT. Pt. was encouraged to report the vision changes to his neurosurgeon since they are acute changes since brain surgery. Pt. education was provided about visual compensatory strategies for ADLs, and IADL. Pt. is able to utilize these strategies with little cuing.  Pt. utilized normal horizontal, and rectilinear visual search strategies during tabletop tasks, and navigating within his environment. Pt. had a few omissions during unstructured random tabletop tasks.    Occupational performance deficits (Please refer to evaluation for details):  ADL's;IADL's    Rehab Potential  Good    OT Duration  12 weeks    OT Treatment/Interventions  Self-care/ADL training;DME and/or AE instruction;Patient/family education;Energy conservation;Therapeutic exercise;Therapeutic activities;Neuromuscular education;Visual/perceptual remediation/compensation    Plan  Formally assess vision to determine how it may be affecting ADLs, and IADLs.    Recommended Other Services  PT    Consulted and Agree with Plan of Care  Patient       Patient will benefit from skilled therapeutic intervention in order to improve the following deficits and impairments:  Impaired UE functional use, Decreased balance, Decreased activity tolerance, Decreased cognition, Decreased range of motion, Decreased strength, Decreased coordination, Impaired vision/preception  Visit Diagnosis: Visual impairment  Low vision, both eyes    Problem List Patient Active Problem List   Diagnosis Date Noted  . Atrial fibrillation ([place]) 06/01/2016  .  Pneumonia 04/06/2016    [name] 11/14/2017, 5:53 PM  [place] MAIN [facility] SERVICES [address] [place], [place], [address] Phone: [phone]   Fax:  [phone]  Name: JAMILE SIVILS MRN: [id number] Date of Birth: 1941-06-22

## 2017-11-16 ENCOUNTER — Ambulatory Visit: Payer: Medicare HMO | Admitting: Physical Therapy

## 2017-11-16 ENCOUNTER — Encounter: Payer: Medicare HMO | Admitting: Occupational Therapy

## 2017-11-18 ENCOUNTER — Emergency Department: Payer: Medicare HMO

## 2017-11-18 ENCOUNTER — Emergency Department
Admission: EM | Admit: 2017-11-18 | Discharge: 2017-11-18 | Disposition: A | Discharge status: Home / Self Care | Payer: Medicare HMO | Attending: Emergency Medicine | Admitting: Emergency Medicine

## 2017-11-18 ENCOUNTER — Other Ambulatory Visit: Payer: Self-pay

## 2017-11-18 DIAGNOSIS — Z87891 Personal history of nicotine dependence: Secondary | ICD-10-CM | POA: Insufficient documentation

## 2017-11-18 DIAGNOSIS — K5900 Constipation, unspecified: Secondary | ICD-10-CM | POA: Insufficient documentation

## 2017-11-18 DIAGNOSIS — Z7901 Long term (current) use of anticoagulants: Secondary | ICD-10-CM | POA: Diagnosis not present

## 2017-11-18 DIAGNOSIS — Z79899 Other long term (current) drug therapy: Secondary | ICD-10-CM | POA: Diagnosis not present

## 2017-11-18 DIAGNOSIS — R1084 Generalized abdominal pain: Secondary | ICD-10-CM

## 2017-11-18 DIAGNOSIS — Z96642 Presence of left artificial hip joint: Secondary | ICD-10-CM | POA: Diagnosis not present

## 2017-11-18 LAB — URINALYSIS, COMPLETE (UACMP) WITH MICROSCOPIC
BACTERIA UA: NONE SEEN
BILIRUBIN URINE: NEGATIVE
Glucose, UA: NEGATIVE mg/dL
HGB URINE DIPSTICK: NEGATIVE
KETONES UR: NEGATIVE mg/dL
Leukocytes, UA: NEGATIVE
Nitrite: NEGATIVE
PROTEIN: NEGATIVE mg/dL
Specific Gravity, Urine: 1.019 (ref 1.005–1.030)
pH: 5 (ref 5.0–8.0)

## 2017-11-18 LAB — CBC WITH DIFFERENTIAL/PLATELET
BASOS PCT: 1 %
Basophils Absolute: 0.1 10*3/uL (ref 0–0.1)
EOS ABS: 0.1 10*3/uL (ref 0–0.7)
EOS PCT: 1 %
HCT: 44.5 % (ref 40.0–52.0)
Hemoglobin: 15 g/dL (ref 13.0–18.0)
LYMPHS ABS: 2.3 10*3/uL (ref 1.0–3.6)
Lymphocytes Relative: 24 %
MCH: 27.9 pg (ref 26.0–34.0)
MCHC: 33.6 g/dL (ref 32.0–36.0)
MCV: 83.1 fL (ref 80.0–100.0)
Monocytes Absolute: 0.8 10*3/uL (ref 0.2–1.0)
Monocytes Relative: 9 %
Neutro Abs: 6.1 10*3/uL (ref 1.4–6.5)
Neutrophils Relative %: 65 %
PLATELETS: 260 10*3/uL (ref 150–440)
RBC: 5.36 MIL/uL (ref 4.40–5.90)
RDW: 13.4 % (ref 11.5–14.5)
WBC: 9.4 10*3/uL (ref 3.8–10.6)

## 2017-11-18 LAB — COMPREHENSIVE METABOLIC PANEL
ALBUMIN: 4.1 g/dL (ref 3.5–5.0)
ALK PHOS: 77 U/L (ref 38–126)
ALT: 22 U/L (ref 17–63)
AST: 26 U/L (ref 15–41)
Anion gap: 10 (ref 5–15)
BILIRUBIN TOTAL: 0.8 mg/dL (ref 0.3–1.2)
BUN: 14 mg/dL (ref 6–20)
CO2: 24 mmol/L (ref 22–32)
Calcium: 9.1 mg/dL (ref 8.9–10.3)
Chloride: 103 mmol/L (ref 101–111)
Creatinine, Ser: 0.96 mg/dL (ref 0.61–1.24)
GFR calc Af Amer: >60 mL/min (ref >60)
GFR calc non Af Amer: >60 mL/min (ref >60)
GLUCOSE: 95 mg/dL (ref 65–99)
POTASSIUM: 4.2 mmol/L (ref 3.5–5.1)
Sodium: 137 mmol/L (ref 135–145)
TOTAL PROTEIN: 7.3 g/dL (ref 6.5–8.1)

## 2017-11-18 LAB — TROPONIN I: Troponin I: <0.03 ng/mL (ref <0.03)

## 2017-11-18 LAB — LIPASE, BLOOD: Lipase: 35 U/L (ref 11–51)

## 2017-11-18 MED ORDER — LACTULOSE 10 GM/15ML PO SOLN
ORAL | Status: AC
  Administered 2017-11-18: 20 g via ORAL
  Filled 2017-11-18: qty 30

## 2017-11-18 MED ORDER — MAGNESIUM CITRATE PO SOLN
ORAL | Status: AC
  Administered 2017-11-18: 0.5 via ORAL
  Filled 2017-11-18: qty 296

## 2017-11-18 MED ORDER — ONDANSETRON HCL 4 MG/2ML IJ SOLN
4.0000 mg | Freq: Once | INTRAMUSCULAR | Status: DC
  Filled 2017-11-18: qty 2

## 2017-11-18 MED ORDER — DOCUSATE SODIUM 50 MG/5ML PO LIQD
100.0000 mg | Freq: Once | ORAL | Status: AC
  Administered 2017-11-18: 100 mg via ORAL
  Filled 2017-11-18: qty 10

## 2017-11-18 MED ORDER — FENTANYL CITRATE (PF) 100 MCG/2ML IJ SOLN
50.0000 ug | Freq: Once | INTRAMUSCULAR | Status: DC
  Filled 2017-11-18: qty 2

## 2017-11-18 MED ORDER — MAGNESIUM CITRATE PO SOLN
0.5000 | Freq: Once | ORAL | Status: AC
  Administered 2017-11-18: 0.5 via ORAL

## 2017-11-18 MED ORDER — LACTULOSE 10 GM/15ML PO SOLN
30.0000 g | Freq: Once | ORAL | Status: AC
Start: 2017-11-18 — End: 2017-11-18
  Administered 2017-11-18: 20 g via ORAL

## 2017-11-18 MED ORDER — IOPAMIDOL (ISOVUE-300) INJECTION 61%
30.0000 mL | Freq: Once | INTRAVENOUS | Status: AC
  Administered 2017-11-18: 30 mL via ORAL

## 2017-11-18 MED ORDER — POLYETHYLENE GLYCOL 3350 17 G PO PACK: 17.0000 g | PACK | Freq: Every day | ORAL | 0 refills | Status: AC

## 2017-11-18 NOTE — ED Provider Notes (Signed)
[facility] Emergency Department Provider Note   ____________________________________________   First MD Initiated Contact with Patient 11/18/17 0510     (approximate)  I have reviewed the triage vital signs and the nursing notes.   HISTORY  Chief Complaint Abdominal Pain    HPI Neil Garcia is a 77 y.o. male who presents to the ED from home with a chief complaint of abdominal pain and constipation.  Patient reports no BM for the past 4 days.  Before that he had a drink hot prune juice in order to have a BM.  Usually patient has a daily BM.  4 weeks ago he had surgery to remove an occipital brain tumor and states his bowel schedule has not been regular since.  He is having oncological workup with PET scan next week to discover source of primary malignancy.  Reports generalized abdominal discomfort without associated nausea or vomiting.  Has not been passing gas.  Denies fever, chills, chest pain, shortness of breath, dysuria.  Denies recent travel or trauma.  Takes Eliquis for atrial fibrillation.   Past Medical History:  Diagnosis Date  . Atrial fibrillation ([place])   . Dysrhythmia   . Hypoglycemia   . Seasonal allergies     Patient Active Problem List   Diagnosis Date Noted  . Atrial fibrillation ([place]) 06/01/2016  . Pneumonia 04/06/2016    Past Surgical History:  Procedure Laterality Date  . CARDIOVERSION N/A 12/14/2016   Procedure: CARDIOVERSION;  Surgeon: Teodoro Spray, MD;  Location: [facility] ORS;  Service: Cardiovascular;  Laterality: N/A;  . collarbone Left   . COLONOSCOPY N/A 02/07/2015   Procedure: COLONOSCOPY;  Surgeon: Josefine Class, MD;  Location[facility] ENDOSCOPY;  Service: Endoscopy;  Laterality: N/A;  . ELECTROPHYSIOLOGIC STUDY N/A 05/28/2016   Procedure: CARDIOVERSION;  Surgeon: Teodoro Spray, MD;  Location: [facility] ORS;  Service: Cardiovascular;  Laterality: N/A;  . ELECTROPHYSIOLOGIC STUDY N/A 06/02/2016   Procedure: CARDIOVERSION;   Surgeon: Teodoro Spray, MD;  Location: [facility] ORS;  Service: Cardiovascular;  Laterality: N/A;  . HERNIA REPAIR    . JOINT REPLACEMENT     LT Hip 2013    Prior to Admission medications   Medication Sig Start Date End Date Taking? Authorizing Provider  apixaban (ELIQUIS) 5 MG TABS tablet Take 5 mg by mouth 2 (two) times daily.    [provider]  flecainide (TAMBOCOR) 50 MG tablet Take 50 mg by mouth 2 (two) times daily.    [provider]  guaiFENesin-dextromethorphan (ROBITUSSIN DM) 100-10 MG/5ML syrup Take 5 mLs by mouth every 4 (four) hours as needed for cough. Patient not taking: Reported on 12/08/2016 04/08/16   Fritzi Mandes, MD  metoprolol tartrate (LOPRESSOR) 25 MG tablet Take 25 mg by mouth 2 (two) times daily.    [provider]  terazosin (HYTRIN) 5 MG capsule Take 5 mg by mouth at bedtime.     [provider]  triamcinolone (NASACORT AQ) 55 MCG/ACT AERO nasal inhaler Place 1 spray into the nose daily.    [provider]    Allergies Prednisone; Statins; and Celecoxib  Family History  Problem Relation Age of Onset  . Alzheimer's disease Mother   . Brain cancer Father     Social History Social History   Tobacco Use  . Smoking status: Former Research scientist (life sciences)  . Smokeless tobacco: Former Network engineer Use Topics  . Alcohol use: No  . Drug use: No    Review of Systems  Constitutional: No fever/chills. Eyes: No visual changes. ENT: No sore throat. Cardiovascular: Denies chest pain. Respiratory: Denies shortness of breath. Gastrointestinal: Positive for abdominal pain.  No nausea, no vomiting.  No diarrhea.  Positive for constipation. Genitourinary: Negative for dysuria. Musculoskeletal: Negative for back pain. Skin: Negative for rash. Neurological: Negative for headaches, focal weakness or numbness.   ____________________________________________   PHYSICAL EXAM:  VITAL SIGNS: ED Triage Vitals [11/18/17 0411]  Enc Vitals  Group     BP (!) 164/104     Pulse Rate 91     Resp 20     Temp 97.9 F (36.6 C)     Temp Source Oral     SpO2 98 %     Weight 275 lb (124.7 kg)     Height 5\' 11"  (1.803 m)     Head Circumference      Peak Flow      Pain Score 6     Pain Loc      Pain Edu?      Excl. in [place]?     Constitutional: Alert and oriented. Well appearing and in no acute distress. Eyes: Conjunctivae are normal. PERRL. EOMI. Head: Atraumatic. Nose: No congestion/rhinnorhea. Mouth/Throat: Mucous membranes are moist.  Oropharynx non-erythematous. Neck: No stridor.  No carotid bruits. Cardiovascular: Normal rate, regular rhythm. Grossly normal heart sounds.  Good peripheral circulation. Respiratory: Normal respiratory effort.  No retractions. Lungs CTAB. Gastrointestinal: Soft and mildly tender to palpation diffusely.  Mild distention. No abdominal bruits. No CVA tenderness. Musculoskeletal: No lower extremity tenderness nor edema.  No joint effusions. Neurologic:  Normal speech and language. No gross focal neurologic deficits are appreciated. No gait instability. Skin:  Skin is warm, dry and intact. No rash noted. Psychiatric: Mood and affect are normal. Speech and behavior are normal.  ____________________________________________   LABS (all labs ordered are listed, but only abnormal results are displayed)  Labs Reviewed  URINALYSIS, COMPLETE (UACMP) WITH MICROSCOPIC - Abnormal; Notable for the following components:      Result Value   Color, Urine YELLOW (*)    APPearance CLEAR (*)    Squamous Epithelial / LPF 0-5 (*)    All other components within normal limits  CBC WITH DIFFERENTIAL/PLATELET  COMPREHENSIVE METABOLIC PANEL  LIPASE, BLOOD  TROPONIN I   ____________________________________________  EKG  None ____________________________________________  RADIOLOGY  ED MD interpretation: Prominent loop of small bowel  Official radiology report(s): Dg Abd Acute W/chest  Result Date:  11/18/2017 CLINICAL DATA:  Initial evaluation for acute abdominal pain, constipation. EXAM: DG ABDOMEN ACUTE W/ 1V CHEST COMPARISON:  Prior radiograph facet/25/17. FINDINGS: Mild cardiomegaly.  Mediastinal silhouette normal. Lungs normally inflated. No focal infiltrates. No pulmonary edema or pleural effusion. No pneumothorax. Bowel gas pattern within normal limits without evidence for obstruction or ileus. No free air on upright view. Mildly prominent loop of small bowel within the mid/left abdomen measuring up to 3.7 cm, nonspecific. Overall stool burden within normal limits. No soft tissue mass or abnormal calcification. Left total hip arthroplasty. 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 Prominent degenerative spondylolysis throughout the spine. IMPRESSION: 1. Nonobstructive bowel gas pattern. Single mildly prominent loop of small bowel within the mid abdomen, nonspecific, but could reflect a focal enteritis or be reactive in nature. 2. Overall stool burden is mild in nature. 3. No active cardiopulmonary disease. Electronically Signed   By: Jeannine Boga M.D.   On: 11/18/2017 06:58    ____________________________________________   PROCEDURES  Procedure(s) performed: None  Procedures  Critical Care performed: No  ____________________________________________   INITIAL IMPRESSION / ASSESSMENT AND PLAN / ED COURSE  As part of my medical decision making, I reviewed the following data within the [place] History obtained from family, Nursing notes reviewed and incorporated, Labs reviewed, Old chart reviewed, Radiograph reviewed  and Notes from prior ED visits   77 year old male who presents with abdominal pain secondary to constipation approximately 1 month postop brain tumor removal.  Review of records demonstrate that patient had CT chest, abdomen and pelvis on 2/1 which did not reveal obvious tumor/source of primary malignancy.  Laboratory and urinalysis  results are unremarkable.  Given that patient is scheduled for a PET scan next week, will start with abdominal x-rays to evaluate constipation, ileus, SBO, etc.  Clinical Course as of Nov 19 711  Fri Nov 18, 2017  0712 Updated patient and family of x-ray imaging results.  Prominent loop of small bowel concerning for possible obstruction.  To my eye, patient's stool burden looks to be more than mild.  Will proceed with CT abdomen/pelvis with oral contrast only to evaluate for enteritis, colitis, SBO, ileus, etc.  If unremarkable, would proceed with soapsuds enema.  At this time care is transferred to Dr. Archie Balboa pending CT results and disposition.  [JS]    Clinical Course User Index [JS] Paulette Blanch, MD     ____________________________________________   FINAL CLINICAL IMPRESSION(S) / ED DIAGNOSES  Final diagnoses:  Generalized abdominal pain  Constipation, unspecified constipation type     ED Discharge Orders    None       Note:  This document was prepared using Dragon voice recognition software and may include unintentional dictation errors.    Paulette Blanch, MD 11/18/17 [phone]

## 2017-11-18 NOTE — Discharge Instructions (Signed)
Please seek medical attention for any high fevers, chest pain, shortness of breath, change in behavior, persistent vomiting, bloody stool or any other new or concerning symptoms.  

## 2017-11-18 NOTE — ED Notes (Signed)
Pt reports having brain surgery 4 weeks ago and he states that the stool softners they gave him after surgery helped some but he feels "stopped up". States that he hasn't really gone in the last 3 days.

## 2017-11-18 NOTE — ED Triage Notes (Signed)
Pt in with co abd pain states has not had BM for 4 days. Pt found out yesterday he had a malignant brain tumor that was removed 4 weeks ago. Pt having PET scan done to further evaluate for other tumors.

## 2017-11-21 ENCOUNTER — Ambulatory Visit: Payer: Medicare HMO | Admitting: Occupational Therapy

## 2017-11-21 ENCOUNTER — Ambulatory Visit: Payer: Medicare HMO | Admitting: Physical Therapy

## 2017-11-23 ENCOUNTER — Encounter: Payer: Medicare HMO | Admitting: Occupational Therapy

## 2017-11-23 ENCOUNTER — Ambulatory Visit: Payer: Medicare HMO | Admitting: Physical Therapy

## 2017-11-28 ENCOUNTER — Ambulatory Visit: Payer: Medicare HMO | Admitting: Physical Therapy

## 2017-11-28 ENCOUNTER — Encounter: Payer: Medicare HMO | Admitting: Occupational Therapy

## 2017-11-30 ENCOUNTER — Ambulatory Visit: Payer: Medicare HMO | Admitting: Physical Therapy

## 2017-11-30 ENCOUNTER — Encounter: Payer: Medicare HMO | Admitting: Occupational Therapy

## 2017-12-05 ENCOUNTER — Ambulatory Visit: Payer: Medicare HMO | Admitting: Physical Therapy

## 2017-12-05 ENCOUNTER — Ambulatory Visit: Payer: Medicare HMO | Admitting: Occupational Therapy

## 2017-12-07 ENCOUNTER — Ambulatory Visit: Payer: Medicare HMO | Admitting: Physical Therapy

## 2017-12-07 ENCOUNTER — Encounter: Payer: Medicare HMO | Admitting: Occupational Therapy

## 2018-01-05 ENCOUNTER — Emergency Department
Admission: EM | Admit: 2018-01-05 | Discharge: 2018-01-05 | Disposition: A | Discharge status: Home / Self Care | Payer: Medicare HMO | Attending: Emergency Medicine | Admitting: Emergency Medicine

## 2018-01-05 ENCOUNTER — Emergency Department: Payer: Medicare HMO

## 2018-01-05 ENCOUNTER — Encounter: Payer: Self-pay | Admitting: Emergency Medicine

## 2018-01-05 DIAGNOSIS — Z86011 Personal history of benign neoplasm of the brain: Secondary | ICD-10-CM | POA: Insufficient documentation

## 2018-01-05 DIAGNOSIS — Z87891 Personal history of nicotine dependence: Secondary | ICD-10-CM | POA: Diagnosis not present

## 2018-01-05 DIAGNOSIS — R110 Nausea: Secondary | ICD-10-CM

## 2018-01-05 DIAGNOSIS — Z79899 Other long term (current) drug therapy: Secondary | ICD-10-CM | POA: Insufficient documentation

## 2018-01-05 DIAGNOSIS — E860 Dehydration: Secondary | ICD-10-CM

## 2018-01-05 DIAGNOSIS — Z96642 Presence of left artificial hip joint: Secondary | ICD-10-CM | POA: Diagnosis not present

## 2018-01-05 LAB — COMPREHENSIVE METABOLIC PANEL
ALBUMIN: 4.2 g/dL (ref 3.5–5.0)
ALT: 36 U/L (ref 17–63)
AST: 26 U/L (ref 15–41)
Alkaline Phosphatase: 63 U/L (ref 38–126)
Anion gap: 7 (ref 5–15)
BILIRUBIN TOTAL: 0.9 mg/dL (ref 0.3–1.2)
BUN: 12 mg/dL (ref 6–20)
CO2: 27 mmol/L (ref 22–32)
Calcium: 9.2 mg/dL (ref 8.9–10.3)
Chloride: 102 mmol/L (ref 101–111)
Creatinine, Ser: 1.02 mg/dL (ref 0.61–1.24)
GFR calc Af Amer: >60 mL/min (ref >60)
GFR calc non Af Amer: >60 mL/min (ref >60)
GLUCOSE: 106 mg/dL — AB (ref 65–99)
Potassium: 3.8 mmol/L (ref 3.5–5.1)
SODIUM: 136 mmol/L (ref 135–145)
Total Protein: 7.4 g/dL (ref 6.5–8.1)

## 2018-01-05 LAB — URINALYSIS, COMPLETE (UACMP) WITH MICROSCOPIC
BACTERIA UA: NONE SEEN
BILIRUBIN URINE: NEGATIVE
Glucose, UA: NEGATIVE mg/dL
Hgb urine dipstick: NEGATIVE
Ketones, ur: NEGATIVE mg/dL
Leukocytes, UA: NEGATIVE
NITRITE: NEGATIVE
Protein, ur: NEGATIVE mg/dL
SPECIFIC GRAVITY, URINE: 1.015 (ref 1.005–1.030)
SQUAMOUS EPITHELIAL / LPF: NONE SEEN (ref 0–5)
pH: 6 (ref 5.0–8.0)

## 2018-01-05 LAB — CBC
HCT: 45.5 % (ref 40.0–52.0)
HEMOGLOBIN: 15.7 g/dL (ref 13.0–18.0)
MCH: 29.4 pg (ref 26.0–34.0)
MCHC: 34.6 g/dL (ref 32.0–36.0)
MCV: 85.1 fL (ref 80.0–100.0)
Platelets: 202 10*3/uL (ref 150–440)
RBC: 5.35 MIL/uL (ref 4.40–5.90)
RDW: 15.1 % — ABNORMAL HIGH (ref 11.5–14.5)
WBC: 7 10*3/uL (ref 3.8–10.6)

## 2018-01-05 LAB — LIPASE, BLOOD: Lipase: 32 U/L (ref 11–51)

## 2018-01-05 MED ORDER — DEXAMETHASONE 0.5 MG PO TABS: 0.5000 mg | ORAL_TABLET | Freq: Once | ORAL | Status: DC

## 2018-01-05 MED ORDER — SODIUM CHLORIDE 0.9 % IV BOLUS
1000.0000 mL | Freq: Once | INTRAVENOUS | Status: AC
  Administered 2018-01-05: 1000 mL via INTRAVENOUS

## 2018-01-05 MED ORDER — DEXAMETHASONE 0.5 MG PO TABS: 0.5000 mg | ORAL_TABLET | Freq: Two times a day (BID) | ORAL | 0 refills | Status: AC

## 2018-01-05 MED ORDER — ONDANSETRON HCL 4 MG/2ML IJ SOLN
INTRAMUSCULAR | Status: AC
  Administered 2018-01-05: 4 mg
  Filled 2018-01-05: qty 2

## 2018-01-05 MED ORDER — ONDANSETRON HCL 4 MG PO TABS: 4.0000 mg | ORAL_TABLET | Freq: Three times a day (TID) | ORAL | 0 refills | Status: AC | PRN

## 2018-01-05 MED ORDER — METOPROLOL TARTRATE 5 MG/5ML IV SOLN
5.0000 mg | Freq: Once | INTRAVENOUS | Status: AC
  Administered 2018-01-05: 5 mg via INTRAVENOUS
  Filled 2018-01-05: qty 5

## 2018-01-05 NOTE — ED Triage Notes (Signed)
Pt comes into the ED via POV with his family c/o weakness and N/V.  Patient has h/o brain cancer and is undergoing radiation treatments.  Patient states that this started on Sunday and Monday.  Patient called his [facility] physician and told him he needed to be seen to make sure there is no fluid on his brain causing the N/V.  Patient Is neurologically intact at this time and in NAD with even and unlabored respirations.

## 2018-01-05 NOTE — Discharge Instructions (Addendum)
Please seek medical attention for any high fevers, chest pain, shortness of breath, change in behavior, persistent vomiting, bloody stool or any other new or concerning symptoms.  

## 2018-01-05 NOTE — ED Notes (Signed)
[place] for consult  [phone]

## 2018-01-05 NOTE — ED Notes (Signed)
Helped pt ambulate.  Pt tolerated this well.  Some chronic left hip pain

## 2018-01-05 NOTE — ED Provider Notes (Signed)
[facility] Emergency Department Provider Note  ____________________________________________   I have reviewed the triage vital signs and the nursing notes.   HISTORY  Chief Complaint Weakness and Nausea   History limited by: Not Limited   HPI Neil Garcia is a 77 y.o. male who presents to the emergency department today because of primary concern for weakness. The patient states that the weakness has been getting progressively worse over the past few days. He does have history of recent whole brain radiation. Has had some associated nausea. Additionally states that he feels like his sensation of taste has changed recently and that food all tastes burnt. The patient denies any fevers.    Per medical record review patient has a history of atrial fibrillation.  Past Medical History:  Diagnosis Date  . Atrial fibrillation ([place])   . Dysrhythmia   . Hypoglycemia   . Seasonal allergies     Patient Active Problem List   Diagnosis Date Noted  . Atrial fibrillation ([place]) 06/01/2016  . Pneumonia 04/06/2016    Past Surgical History:  Procedure Laterality Date  . CARDIOVERSION N/A 12/14/2016   Procedure: CARDIOVERSION;  Surgeon: Teodoro Spray, MD;  Location: [facility] ORS;  Service: Cardiovascular;  Laterality: N/A;  . collarbone Left   . COLONOSCOPY N/A 02/07/2015   Procedure: COLONOSCOPY;  Surgeon: Josefine Class, MD;  Location: [facility] ENDOSCOPY;  Service: Endoscopy;  Laterality: N/A;  . ELECTROPHYSIOLOGIC STUDY N/A 05/28/2016   Procedure: CARDIOVERSION;  Surgeon: Teodoro Spray, MD;  Location: [facility] ORS;  Service: Cardiovascular;  Laterality: N/A;  . ELECTROPHYSIOLOGIC STUDY N/A 06/02/2016   Procedure: CARDIOVERSION;  Surgeon: Teodoro Spray, MD;  Location: [facility] ORS;  Service: Cardiovascular;  Laterality: N/A;  . HERNIA REPAIR    . JOINT REPLACEMENT     LT Hip 2013    Prior to Admission medications   Medication Sig Start Date End Date Taking? Authorizing  Provider  apixaban (ELIQUIS) 5 MG TABS tablet Take 5 mg by mouth 2 (two) times daily.    [provider]  guaiFENesin-dextromethorphan (ROBITUSSIN DM) 100-10 MG/5ML syrup Take 5 mLs by mouth every 4 (four) hours as needed for cough. Patient not taking: Reported on 12/08/2016 04/08/16   Fritzi Mandes, MD  metoprolol tartrate (LOPRESSOR) 25 MG tablet Take 25 mg by mouth 2 (two) times daily.    [provider]  polyethylene glycol (MIRALAX) packet Take 17 g by mouth daily. 11/18/17   Nance Pear, MD    Allergies Prednisone; Statins; and Celecoxib  Family History  Problem Relation Age of Onset  . Alzheimer's disease Mother   . Brain cancer Father     Social History Social History   Tobacco Use  . Smoking status: Former Research scientist (life sciences)  . Smokeless tobacco: Former Network engineer Use Topics  . Alcohol use: No  . Drug use: No   Review of Systems Constitutional: No fever/chills. Positive for weakness.  Eyes: No visual changes. ENT: No sore throat. Cardiovascular: Denies chest pain. Respiratory: Denies shortness of breath. Gastrointestinal: No abdominal pain.  No nausea, no vomiting.  No diarrhea.   Genitourinary: Negative for dysuria. Musculoskeletal: Negative for back pain. Skin: Negative for rash. Neurological: Change in sense of taste.  ____________________________________________   PHYSICAL EXAM:  VITAL SIGNS: ED Triage Vitals [01/05/18 1424]  Enc Vitals Group     BP (!) 95/55     Pulse Rate 77     Resp 18     Temp 98.5 F (36.9 C)  Temp Source Oral     SpO2 97 %     Weight 256 lb (116.1 kg)     Height 5\' 11"  (1.803 m)     Head Circumference      Peak Flow      Pain Score 0   Constitutional: Alert and oriented. Well appearing and in no distress. Eyes: Conjunctivae are normal.  ENT   Head: Normocephalic and atraumatic.   Nose: No congestion/rhinnorhea.   Mouth/Throat: Mucous membranes are moist.   Neck: No  stridor. Hematological/Lymphatic/Immunilogical: No cervical lymphadenopathy. Cardiovascular: Irregularly irregular rhythm.  No murmurs, rubs, or gallops.  Respiratory: Normal respiratory effort without tachypnea nor retractions. Breath sounds are clear and equal bilaterally. No wheezes/rales/rhonchi. Gastrointestinal: Soft and non tender. No rebound. No guarding.  Genitourinary: Deferred Musculoskeletal: Normal range of motion in all extremities. No lower extremity edema. Neurologic:  Normal speech and language. No gross focal neurologic deficits are appreciated.  Skin:  Skin is warm, dry and intact. No rash noted. Psychiatric: Mood and affect are normal. Speech and behavior are normal. Patient exhibits appropriate insight and judgment.  ____________________________________________    LABS (pertinent positives/negatives)  UA not consistent with infection Lipase 32 CBC wbc 7.0, hgb 15.7, plt 202 CMP wnl except glu 106 ____________________________________________   EKG  I, Nance Pear, attending physician, personally viewed and interpreted this EKG  EKG Time: 1430 Rate: 148 Rhythm: atrial fib with RVR Axis: normal Intervals: qtc 439 QRS: narrow, q waves v1 ST changes: no st elevation Impression: abnormal ekg   ____________________________________________    RADIOLOGY  CT head Interval craniotomy, some residual edema  ____________________________________________   PROCEDURES  Procedures  ____________________________________________   INITIAL IMPRESSION / ASSESSMENT AND PLAN / ED COURSE  Pertinent labs & imaging results that were available during my care of the patient were reviewed by me and considered in my medical decision making (see chart for details).  Patient presented to the emergency department today because of concerns for weakness and possible dehydration.  Family also concern for possible continued edema to the brain.  On exam patient is awake  and alert.  He does not appear to be in any distress.  Heart rate is A. fib with RVR.  Patient has known history of atrial fibrillation.  Work-up was sent to evaluate for possible signs of dehydration or infection as well as head CT to evaluate for edema.  Patient did have a mild amount of residual edema on the head CT.  No obvious signs of infection patient did feel better after IV fluids and medication.  Was able to get up and walk around with slightly more strength.  Will plan on starting steroids.  Discussed with patient importance of following up with oncologist.   ____________________________________________   FINAL CLINICAL IMPRESSION(S) / ED DIAGNOSES  Final diagnoses:  Dehydration  Nausea     Note: This dictation was prepared with Dragon dictation. Any transcriptional errors that result from this process are unintentional     Nance Pear, MD 01/06/18 [phone]

## 2018-05-14 DEATH — deceased

## 2018-07-10 ENCOUNTER — Encounter: Payer: Self-pay | Admitting: Occupational Therapy

## 2018-07-10 DIAGNOSIS — H547 Unspecified visual loss: Secondary | ICD-10-CM

## 2018-07-10 DIAGNOSIS — M6281 Muscle weakness (generalized): Secondary | ICD-10-CM

## 2018-07-10 NOTE — Therapy (Signed)
[place] MAIN [facility] SERVICES [address] [place], [place], [address] Phone: [phone]   Fax:  [phone]  July 10, 2018    @CCLISTADDRESS @  Occupational Therapy Discharge Summary   Patient: Neil Garcia MRN: [id number] Date of Birth: 04-10-1941  Diagnosis: Visual impairment  No data recorded  The above patient had been seen in Occupational Therapy for 3 scheduled visits.  The treatment consisted of ADL training, IADL training, neuromuscular re-education, visual perceptual training, and pt. Education. The patient is: worsened  Subjective: Pt. presented to the last session on 11/14/2017 with visual changes. Pt. 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 decided not to return to therapy in order to focus on radiation, and chemotherapy.   OT Long Term Goals - 11/02/17 1213      OT LONG TERM GOAL #1   Title  Pt. will independently demonstrate visual compensatory strategies for during tabletop ADL tasks within his near space.     Baseline  Pt has difficulty    Time  12    Period  Weeks    Status  New    Target Date  01/25/18      OT LONG TERM GOAL #2   Title  Pt. will independently demonstrate visual compensatory strategies when navigating his envronment, and new palces during ADLs, and IADLs.     Baseline  Pt. has difficulty    Time  12    Period  Weeks    Status  New    Target Date  01/25/18      OT LONG TERM GOAL #3   Title  Pt. will improve Left hand FMC, speed and dexterity to be able to perfrom fishing tasks efficiently.    Baseline  Pt. has difficulty    Time  12    Period  Weeks    Status  New    Target Date  01/25/18      OT LONG TERM GOAL #4   Title  Pt. will independently demonstrate good safety awareness, and judgement duirng ADLs, and IADLs    Baseline  Pt. has difficulty    Time  12    Period  Weeks    Status  New    Target Date  01/25/18          Sincerely,  [name], MS, OTR/L  CC @[email or web address] @  [place] [address] [place], [place], [address] Phone: [phone]   Fax:  [phone]  Patient: [name] MRN: [id number] Date of Birth: 06-27-1941

## 2018-08-07 IMAGING — CT CT ABD-PELV W/O CM
2 of 4 series · 17 of 46 positions shown, 19 images · non-contrast
Comparison: None.

CLINICAL DATA: Abdominal pain and constipation. No bowel movement
for the past 4 days.

EXAM:
CT ABDOMEN AND PELVIS WITHOUT CONTRAST
TECHNIQUE: Multidetector CT imaging of the abdomen and pelvis was performed
following the standard protocol without IV contrast.

[Series 2: routine abd/pel wo · axial · 0.87mm/px · z∈[-1222,-762]mm · 14 of 102 slices shown, 16 images]
[im 5/102  soft-tissue]
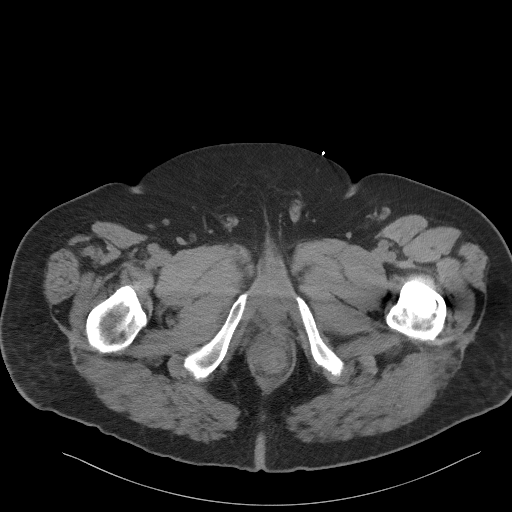
[im 5/102  bone]
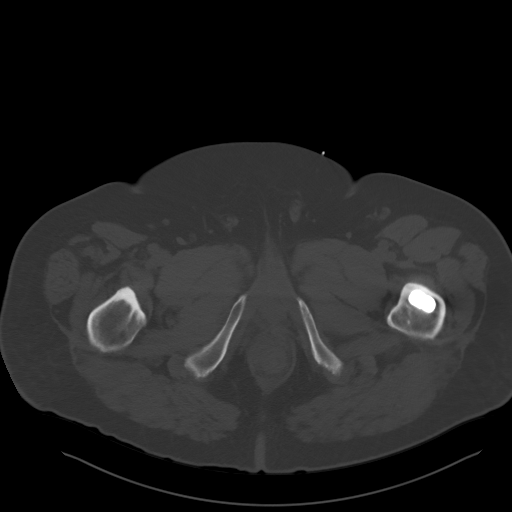
[im 14/102  soft-tissue]
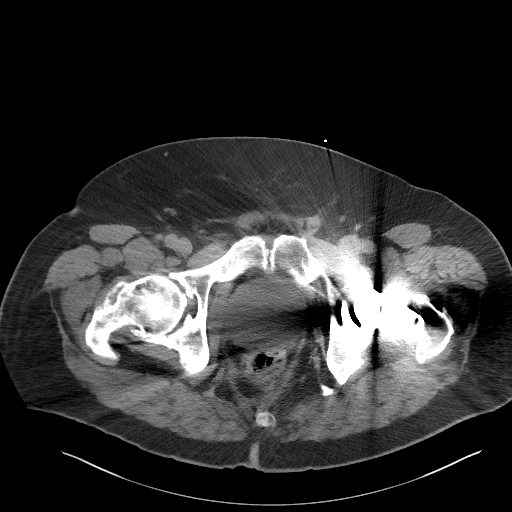
[im 18/102  soft-tissue]
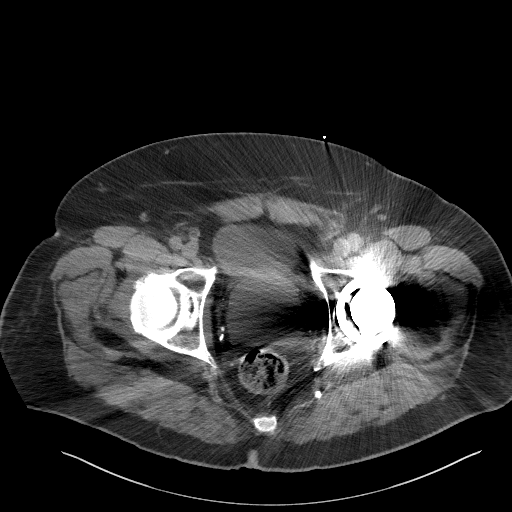
[im 27/102  soft-tissue]
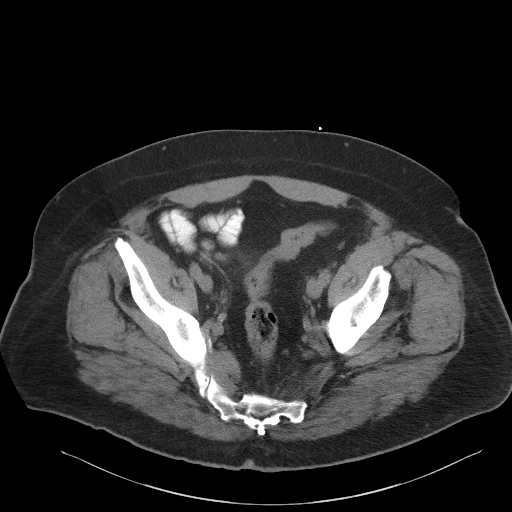
[im 36/102  soft-tissue]
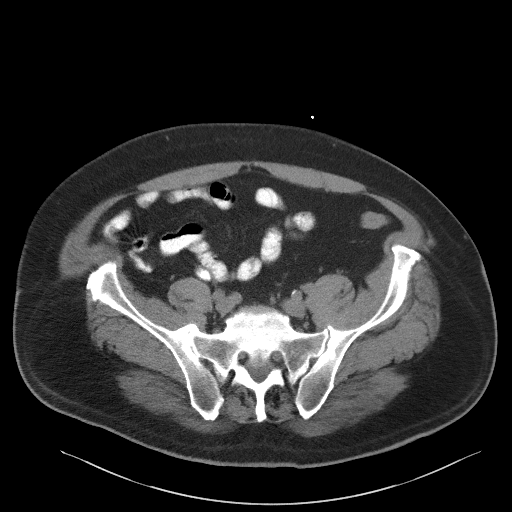
[im 40/102  soft-tissue]
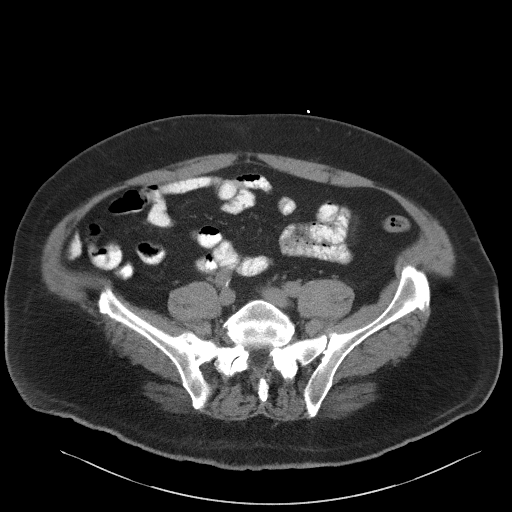
[im 49/102  soft-tissue]
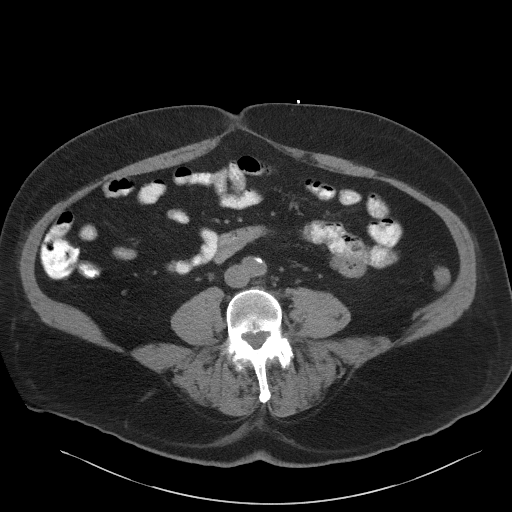
[im 53/102  soft-tissue]
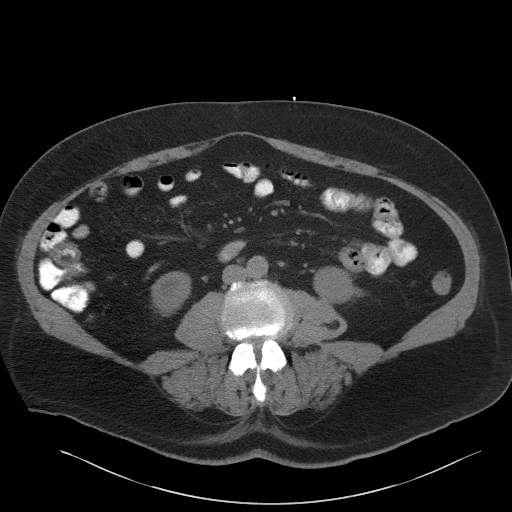
[im 62/102  soft-tissue]
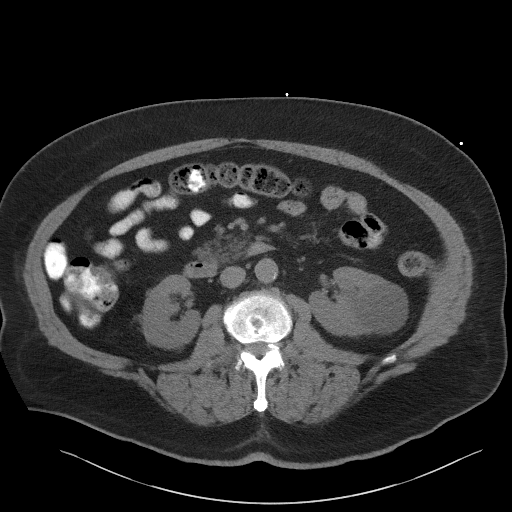
[im 62/102  bone]
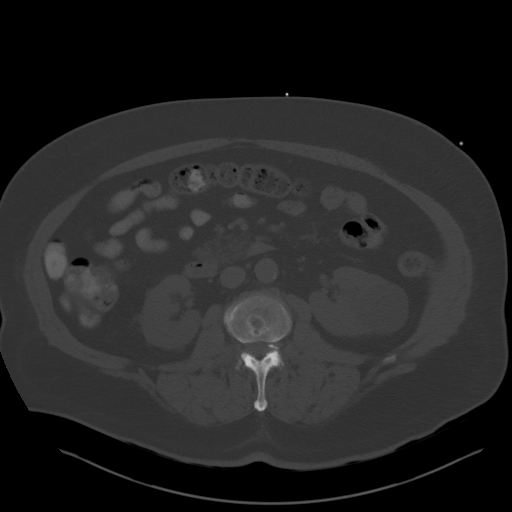
[im 66/102  soft-tissue]
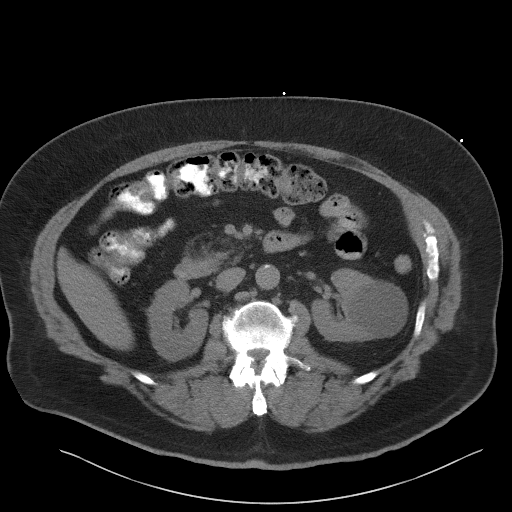
[im 75/102  soft-tissue]
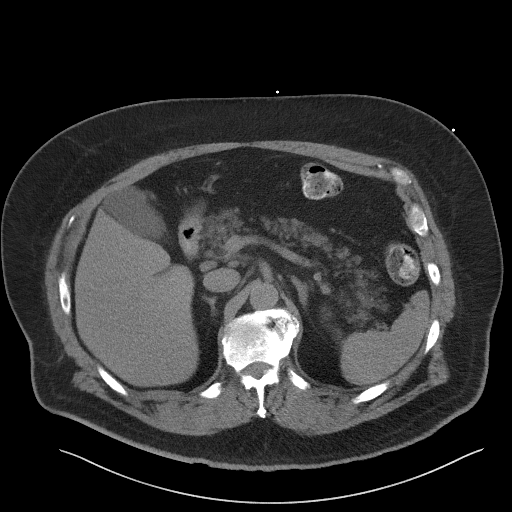
[im 84/102  soft-tissue]
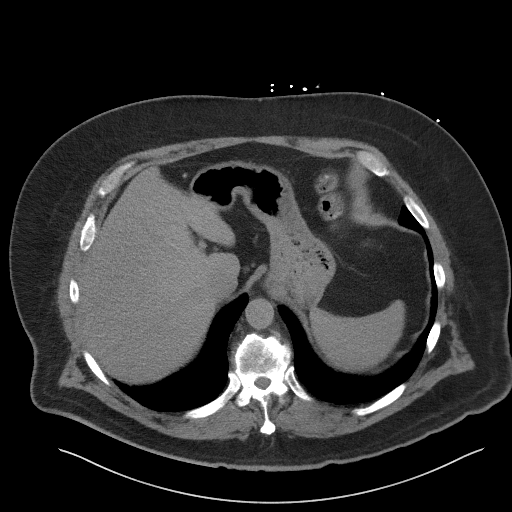
[im 88/102  soft-tissue]
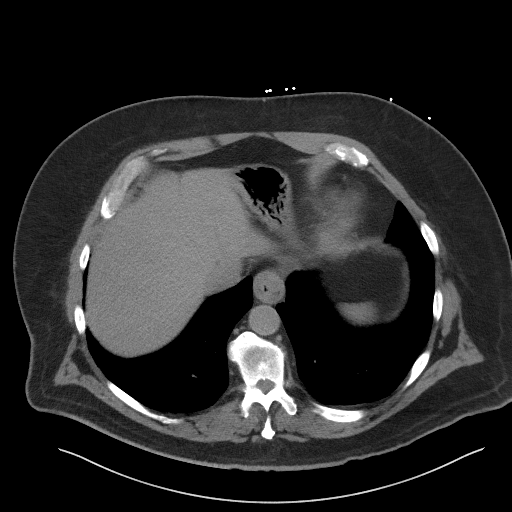
[im 97/102  soft-tissue]
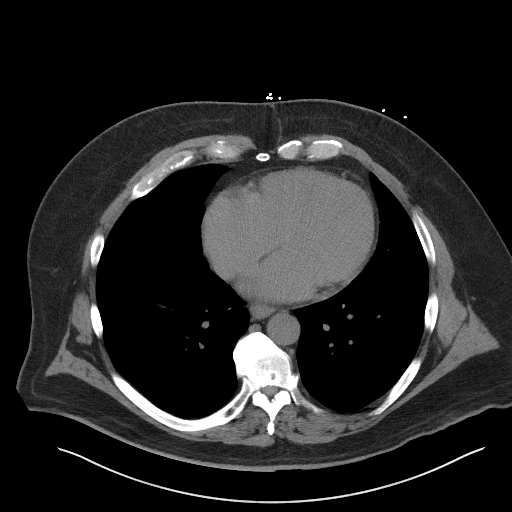

[Series 5: coronal st · coronal · 0.97mm/px · 3 of 112 slices shown]
[im 38/112  soft-tissue]
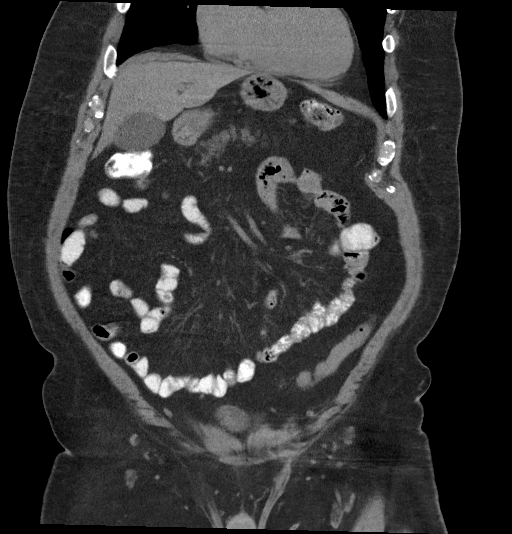
[im 50/112  soft-tissue]
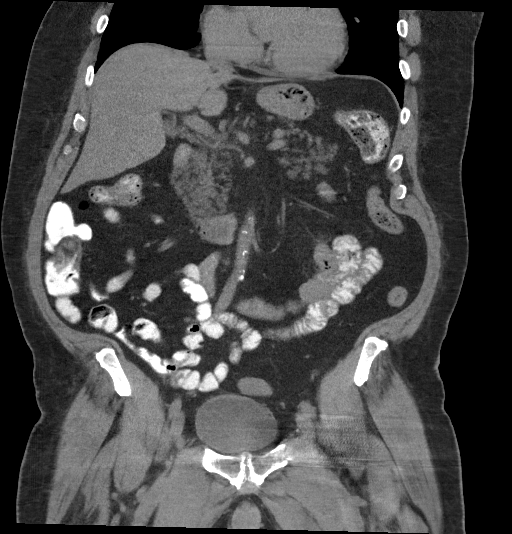
[im 62/112  soft-tissue]
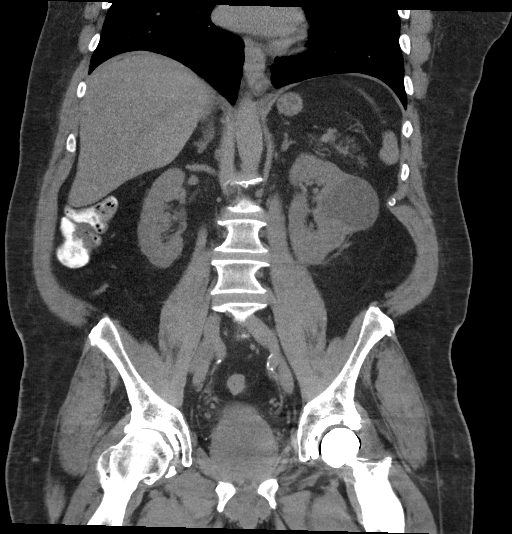

[17 of 46 positions shown; findings below may reference images not displayed]

FINDINGS: Lower chest: No acute abnormality.

Hepatobiliary: No focal liver abnormality is seen. No gallstones,
gallbladder wall thickening, or biliary dilatation.

Pancreas: Mild atrophy. No ductal dilatation or surrounding
inflammatory changes.

Spleen: Normal in size without focal abnormality.

Adrenals/Urinary Tract: The adrenal glands are unremarkable. There
is a 1.4 cm simple cyst in the lower pole of the right kidney. There
is a 5.8 cm simple cyst in the midpole of the left kidney. No renal
or ureteral calculi. No hydronephrosis. The bladder is unremarkable.

Stomach/Bowel: Small hiatal hernia. The stomach is otherwise within
normal limits. No evidence of bowel wall thickening, distention, or
surrounding inflammatory changes. Mild colonic stool burden. Normal
appendix.

Vascular/Lymphatic: Aortic atherosclerosis. No enlarged abdominal or
pelvic lymph nodes.

Reproductive: Marked enlargement of the prostate gland.

Other: Tiny fat containing umbilical hernia. No free fluid or
pneumoperitoneum.

Musculoskeletal: No acute or suspicious osseous findings. Multilevel
degenerative changes of the thoracolumbar spine prior left total hip
arthroplasty. Moderate to severe right hip osteoarthritis.
IMPRESSION: 1.  No acute intra-abdominal process.  No bowel obstruction.
2. Mild colonic stool burden, favoring constipation.
3. Marked prostatomegaly.
